# Patient Record
Sex: Female | Born: 1980 | Race: White | Hispanic: No | Marital: Married | Smoking: Never smoker
Health system: Southern US, Community
[De-identification: ages and names within clinical notes are randomized; demographics above are authoritative.]

## PROBLEM LIST (undated history)

## (undated) DIAGNOSIS — F909 Attention-deficit hyperactivity disorder, unspecified type: Secondary | ICD-10-CM

## (undated) DIAGNOSIS — F419 Anxiety disorder, unspecified: Secondary | ICD-10-CM

## (undated) HISTORY — DX: Attention-deficit hyperactivity disorder, unspecified type: F90.9

## (undated) HISTORY — DX: Anxiety disorder, unspecified: F41.9

## (undated) HISTORY — PX: KNEE SURGERY: SHX244

---

## 2011-12-01 ENCOUNTER — Ambulatory Visit: Payer: Self-pay | Admitting: Obstetrics and Gynecology

## 2012-07-11 DIAGNOSIS — N979 Female infertility, unspecified: Secondary | ICD-10-CM | POA: Insufficient documentation

## 2012-07-26 DIAGNOSIS — F419 Anxiety disorder, unspecified: Secondary | ICD-10-CM | POA: Insufficient documentation

## 2014-08-12 DIAGNOSIS — F988 Other specified behavioral and emotional disorders with onset usually occurring in childhood and adolescence: Secondary | ICD-10-CM | POA: Insufficient documentation

## 2015-06-03 DIAGNOSIS — D239 Other benign neoplasm of skin, unspecified: Secondary | ICD-10-CM

## 2015-06-03 HISTORY — DX: Other benign neoplasm of skin, unspecified: D23.9

## 2015-12-26 ENCOUNTER — Encounter: Payer: Self-pay | Admitting: Family Medicine

## 2015-12-26 ENCOUNTER — Encounter (INDEPENDENT_AMBULATORY_CARE_PROVIDER_SITE_OTHER): Payer: BC Managed Care – PPO | Admitting: Family Medicine

## 2015-12-26 VITALS — BP 110/70 | HR 80 | Temp 98.7°F | Ht 65.0 in | Wt 144.4 lb

## 2015-12-26 DIAGNOSIS — T7840XA Allergy, unspecified, initial encounter: Secondary | ICD-10-CM

## 2015-12-26 NOTE — Progress Notes (Deleted)
This encounter was created in error - please disregard.

## 2015-12-26 NOTE — Progress Notes (Signed)
Name: Regina [name]   MRN: [id number]    DOB: 07-06-1981   Date:12/26/2015       Progress Note  Subjective  Chief Complaint  Chief Complaint  Patient presents with  . Eye Problem    irritation  . Error    HPI C/o L eye feeling "funny" and swollen for past 3-4 days.  No trauma.  No redness.  No drainage reported.  Periorbital tissue sl. Tender to touch.  She does have seasonal allergies in the spring.Marland Kitchen  No visual disturbances.  R eye ok.    No problem-specific assessment & plan notes found for this encounter.   History reviewed. No pertinent past medical history.  Social History  Substance Use Topics  . Smoking status: Never Smoker   . Smokeless tobacco: Not on file  . Alcohol Use: 0.0 oz/week    0 Standard drinks or equivalent per week     Current outpatient prescriptions:  .  methylphenidate (RITALIN) 10 MG tablet, TAKE 1 1/2 TABS BY MOUTH EVERY MORNING AND 1/2 TO 1 TAB AT NOON AS DIRECTED, Disp: , Rfl: 0  Not on File  Review of Systems  Constitutional: Negative for fever, chills, weight loss and malaise/fatigue.  HENT: Negative for hearing loss.   Eyes: Positive for pain. Negative for blurred vision, double vision, photophobia, discharge and redness.  Skin: Negative for rash.  Neurological: Negative for weakness and headaches.      Objective  Filed Vitals:   12/26/15 1414  BP: 110/70  Pulse: 80  Temp: 98.7 F (37.1 C)  TempSrc: Oral  Height: 5\' 5"  (1.651 m)  Weight: 144 lb 6.4 oz (65.499 kg)     Physical Exam  Constitutional: She is well-developed, well-nourished, and in no distress. No distress.  HENT:  Head: Normocephalic and atraumatic.  Eyes: Conjunctivae and EOM are normal. Pupils are equal, round, and reactive to light. No scleral icterus.  L eye with very minimal periorbital edema, esp. Beneath eye ad just lat. To upper lid.  Minimally tender in this area.  No conjunctival redness.  No drainage.  No foreign body seen.  Normal tearing.  Vitals  reviewed.     No results found for this or any previous visit (from the past 2160 hour(s)).   Assessment & Plan  1. Allergic reaction, initial encounter Cold compresses 3-4 times a day. OTC Claritin, 1 daily.

## 2016-01-01 ENCOUNTER — Telehealth: Payer: Self-pay | Admitting: Family Medicine

## 2016-01-01 NOTE — Telephone Encounter (Signed)
Pt called requesting you call her she states that her eye  still have Infection pt call back # is  [phone]

## 2016-01-02 NOTE — Telephone Encounter (Signed)
Talked to patient.  She saw Optomitrist yesterday.  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  Treatment with eye drops have helped.  She will contact me again early next week if not better/resolved.-jh

## 2016-06-23 DIAGNOSIS — O0993 Supervision of high risk pregnancy, unspecified, third trimester: Secondary | ICD-10-CM | POA: Insufficient documentation

## 2016-06-23 LAB — OB RESULTS CONSOLE HIV ANTIBODY (ROUTINE TESTING): HIV: NONREACTIVE

## 2016-06-23 LAB — OB RESULTS CONSOLE HEPATITIS B SURFACE ANTIGEN: Hepatitis B Surface Ag: NEGATIVE

## 2016-06-23 LAB — OB RESULTS CONSOLE VARICELLA ZOSTER ANTIBODY, IGG: VARICELLA IGG: IMMUNE

## 2016-06-23 LAB — OB RESULTS CONSOLE RUBELLA ANTIBODY, IGM: RUBELLA: IMMUNE

## 2016-06-23 LAB — OB RESULTS CONSOLE RPR: RPR: NONREACTIVE

## 2016-07-21 DIAGNOSIS — Z3183 Encounter for assisted reproductive fertility procedure cycle: Secondary | ICD-10-CM | POA: Insufficient documentation

## 2016-08-11 ENCOUNTER — Ambulatory Visit (INDEPENDENT_AMBULATORY_CARE_PROVIDER_SITE_OTHER): Payer: BC Managed Care – PPO | Admitting: Family Medicine

## 2016-08-11 ENCOUNTER — Encounter: Payer: Self-pay | Admitting: Family Medicine

## 2016-08-11 VITALS — BP 107/72 | HR 91 | Temp 98.1°F | Resp 16 | Ht 65.0 in | Wt 149.6 lb

## 2016-08-11 DIAGNOSIS — R1110 Vomiting, unspecified: Secondary | ICD-10-CM | POA: Diagnosis not present

## 2016-08-11 DIAGNOSIS — J069 Acute upper respiratory infection, unspecified: Secondary | ICD-10-CM

## 2016-08-11 MED ORDER — SALINE SPRAY 0.65 % NA SOLN: 2.0000 | NASAL | 0 refills | Status: DC | PRN

## 2016-08-11 MED ORDER — GUAIFENESIN ER 600 MG PO TB12: 600.0000 mg | ORAL_TABLET | Freq: Two times a day (BID) | ORAL | Status: DC

## 2016-08-11 MED ORDER — OXYMETAZOLINE HCL 0.05 % NA SOLN: 1.0000 | Freq: Two times a day (BID) | NASAL | 0 refills | Status: DC

## 2016-08-11 NOTE — Progress Notes (Signed)
Subjective:    Patient ID: Regina [name], female    DOB: 08/19/1981, 35 y.o.   MRN: [id number]  HPI: Regina [name] is a 35 y.o. female presenting on 08/11/2016 for Nasal Congestion (cough mucus--clear darker when pt cough making her like throwing up HA sinus)   HPI  Pt presents for cough, congestion, no fever. Symptoms started Thursday. Coughing hard- vomited last night. She is 5 months pregnant- does vomit quite a bit. No chest tightness. Lots of nasal congestion. Cough is better today. No fever. Coughing up- clear/white- no blood tinge. No shortness of breath. No trouble breathing.  Home treatment: Tylenol. Decongestant nasal spray. Is taking sudafed- listed on safe list Feeling overall better today.  History reviewed. No pertinent past medical history.  No current outpatient prescriptions on file prior to visit.   No current facility-administered medications on file prior to visit.     Review of Systems  HENT: Positive for congestion, postnasal drip, rhinorrhea and sinus pressure. Negative for sore throat, trouble swallowing and voice change.   Respiratory: Positive for cough. Negative for apnea, chest tightness, shortness of breath and wheezing.   Gastrointestinal: Positive for nausea (r/t pregnancy) and vomiting (post tussive).  Genitourinary: Negative.   Musculoskeletal: Negative.   Neurological: Negative for dizziness, syncope, weakness and headaches.  Hematological: Positive for adenopathy.   Per HPI unless specifically indicated above     Objective:    BP 107/72   Pulse 91   Temp 98.1 F (36.7 C) (Oral)   Resp 16   Ht 5\' 5"  (1.651 m)   Wt 149 lb 9.6 oz (67.9 kg)   LMP 12/03/2015 (Approximate)   SpO2 99%   BMI 24.89 kg/m   Wt Readings from Last 3 Encounters:  08/11/16 149 lb 9.6 oz (67.9 kg)  12/26/15 144 lb 6.4 oz (65.5 kg)    Physical Exam  Constitutional: She appears well-developed and well-nourished. No distress.  HENT:  Head: Normocephalic and atraumatic.   Right Ear: Hearing and tympanic membrane normal. Tympanic membrane is not erythematous and not bulging.  Left Ear: Hearing and tympanic membrane normal. Tympanic membrane is not erythematous and not bulging.  Nose: Mucosal edema and rhinorrhea present. No sinus tenderness or nasal septal hematoma. Right sinus exhibits frontal sinus tenderness. Right sinus exhibits no maxillary sinus tenderness. Left sinus exhibits frontal sinus tenderness.  Mouth/Throat: Uvula is midline and mucous membranes are normal. No uvula swelling. Posterior oropharyngeal erythema (mild) present. No posterior oropharyngeal edema.  Neck: Neck supple. No Brudzinski's sign and no Kernig's sign noted.  Cardiovascular: Normal rate, regular rhythm and normal heart sounds.   Pulmonary/Chest: Breath sounds normal. No accessory muscle usage. No tachypnea. No respiratory distress.  Lymphadenopathy:    She has no cervical adenopathy.   No results found for this or any previous visit.    Assessment & Plan:   Problem List Items Addressed This Visit    None    Visit Diagnoses    Upper respiratory infection    -  Primary   Likley viral etiology. Pt feeling much better today. Treat with supportive care at home. Mucinex DM considered safe in pregnancy. Will treat for sinusitis of symptoms of second worsening occur.    Relevant Medications   guaiFENesin (MUCINEX) 600 MG 12 hr tablet   sodium chloride (OCEAN) 0.65 % SOLN nasal spray   oxymetazoline (AFRIN NASAL SPRAY) 0.05 % nasal spray   Post-tussive emesis       Likely exacerbated by pregnancy. None today.  Treat the cough at this time. Pt has zofran as ordered by OB for severe nausea and vomiting.       Meds ordered this encounter  Medications  . ondansetron (ZOFRAN-ODT) 4 MG disintegrating tablet  . ranitidine (ZANTAC) 300 MG tablet    Sig: Take by mouth.  Marland Kitchen guaiFENesin (MUCINEX) 600 MG 12 hr tablet    Sig: Take 1 tablet (600 mg total) by mouth 2 (two) times daily.     Order Specific Question:   Supervising Provider    Answer:   Arlis Porta [id number]  . sodium chloride (OCEAN) 0.65 % SOLN nasal spray    Sig: Place 2 sprays into both nostrils as needed for congestion.    Refill:  0    Order Specific Question:   Supervising Provider    Answer:   Arlis Porta [id number]  . oxymetazoline (AFRIN NASAL SPRAY) 0.05 % nasal spray    Sig: Place 1 spray into both nostrils 2 (two) times daily. Use for 3 days only.    Dispense:  30 mL    Refill:  0    Order Specific Question:   Supervising Provider    Answer:   Arlis Porta [id number]      Follow up plan: Return if symptoms worsen or fail to improve.

## 2016-08-11 NOTE — Patient Instructions (Signed)
You can use supportive care at home to help with your symptoms. Try Mucinexhelp break up the congestion and soothe your cough. You can takes this twice daily. Honey is a natural cough suppressant- so add it to your tea in the morning.  If you have a humidifer, set that up in your bedroom at night.   Your symptoms are consistent with a viral upper respiratory infection. At this time there is no need for antibiotics.  If your symptoms persist for > 10 days or get better and than worsen please let me know. You may have a secondary bacterial infection.  Please seek immediate medical attention if you develop shortness of breath , chest pain/tightness, fever > 103 F or other concerning symptoms.

## 2016-10-11 DIAGNOSIS — F411 Generalized anxiety disorder: Secondary | ICD-10-CM | POA: Insufficient documentation

## 2016-10-11 DIAGNOSIS — O09523 Supervision of elderly multigravida, third trimester: Secondary | ICD-10-CM | POA: Insufficient documentation

## 2016-10-12 ENCOUNTER — Telehealth: Payer: Self-pay | Admitting: *Deleted

## 2016-10-12 NOTE — Telephone Encounter (Signed)
Patient advised.[place] 

## 2016-10-12 NOTE — Telephone Encounter (Signed)
With her being pregnant, I would not start a benzodiazepam med without GYNs approval.  Best to stick with advice of GYN.-jh

## 2016-10-12 NOTE — Telephone Encounter (Signed)
Patient is 7 mos pregnant. She is having anxiety and was prescribed Buspar 7.5mg  by gyn. It is short acting and takes several weeks to get into system. She called them back and they are not able to get her in this week. She was told to go to ER. Patient wants to know if you can see her? [phone]

## 2016-10-23 ENCOUNTER — Encounter (HOSPITAL_COMMUNITY): Payer: Self-pay

## 2016-11-04 ENCOUNTER — Ambulatory Visit (INDEPENDENT_AMBULATORY_CARE_PROVIDER_SITE_OTHER): Payer: BC Managed Care – PPO | Admitting: Psychiatry

## 2016-11-04 ENCOUNTER — Encounter: Payer: Self-pay | Admitting: Psychiatry

## 2016-11-04 VITALS — BP 119/84 | HR 66 | Temp 97.7°F | Wt 155.8 lb

## 2016-11-04 DIAGNOSIS — F411 Generalized anxiety disorder: Secondary | ICD-10-CM

## 2016-11-04 NOTE — Progress Notes (Signed)
Psychiatric Initial Adult Assessment   Patient Identification: Regina [name] MRN:  [id number] Date of Evaluation:  11/04/2016 Referral Source: Dr.Beasley Chief Complaint:   Chief Complaint    Establish Care; Anxiety     Visit Diagnosis:    ICD-9-CM ICD-10-CM   1. GAD (generalized anxiety disorder) 300.02 F41.1     History of Present Illness:  Patient is a 35 year old Caucasian female who is 8 months pregnant and was referred by her OB/GYN for an evaluation of her anxiety. Patient reports that she started to have panic attacks about a month ago and was started on BuSpar and Ativan as needed by her obstetrician. States that the BuSpar has not been helpful and they started her on Zoloft at 50 mg which has been helpful to an extent. She is continuing to take the Ativan at 0.5 mg as needed every other day. States that she will be starting to take the Zoloft at 100 mg starting today. States that the only other time she has had anxiety was about 10 years ago. Patient reports being very anxious about how she would organize her life after the birth of the baby. She is a Pharmacist, hospital and teaches fourth grade classes. He reports having a good marriage and that they have been trying to have a baby for 7 years. Though she is excited about the baby states that she is very emotional about everything these days. Patient became tearful during the session with this clinician.  Patient denies being depressed. She denies any psychotic symptoms. Denies problems with drugs or alcohol. Denies abuse of any kind. She has never been hospitalized psychiatrically. Reports that she see someone in [place] for ADD and is unable to recall the name of her medication.   Past Psychiatric History: she was seeing a therapist for ADD at [place].  Previous Psychotropic Medications: Yes , Ritalin  Substance Abuse History in the last 12 months:  No.  Consequences of Substance Abuse: Negative  Past Medical History:  Past  Medical History:  Diagnosis Date  . ADHD (attention deficit hyperactivity disorder)   . Anxiety     Past Surgical History:  Procedure Laterality Date  . KNEE SURGERY      Family Psychiatric History: see below  Family History:  Family History  Problem Relation Age of Onset  . Multiple sclerosis Mother   . Anxiety disorder Mother     Social History:   Social History   Social History  . Marital status: Married    Spouse name: N/A  . Number of children: N/A  . Years of education: N/A   Social History Main Topics  . Smoking status: Never Smoker  . Smokeless tobacco: Never Used  . Alcohol use No  . Drug use: No  . Sexual activity: Not Currently   Other Topics Concern  . None   Social History Narrative  . None    Additional Social History: Lives with her husband.  Allergies:  No Known Allergies  Metabolic Disorder Labs: No results found for: HGBA1C, MPG No results found for: PROLACTIN No results found for: CHOL, TRIG, HDL, CHOLHDL, VLDL, LDLCALC   Current Medications: Current Outpatient Prescriptions  Medication Sig Dispense Refill  . busPIRone (BUSPAR) 7.5 MG tablet     . LORazepam (ATIVAN) 0.5 MG tablet     . oxymetazoline (AFRIN NASAL SPRAY) 0.05 % nasal spray Place 1 spray into both nostrils 2 (two) times daily. Use for 3 days only. 30 mL 0  . ranitidine (ZANTAC)  300 MG tablet Take by mouth.    . sertraline (ZOLOFT) 100 MG tablet      No current facility-administered medications for this visit.     Neurologic: Headache: No Seizure: No Paresthesias:No  Musculoskeletal: Strength & Muscle Tone: within normal limits Gait & Station: normal Patient leans: N/A  Psychiatric Specialty Exam: ROS  Blood pressure 119/84, pulse 66, temperature 97.7 F (36.5 C), temperature source Oral, weight 155 lb 12.8 oz (70.7 kg), last menstrual period 12/03/2015.Body mass index is 25.93 kg/m.  General Appearance: Casual  Eye Contact:  Fair  Speech:  Clear and  Coherent  Volume:  Normal  Mood:  Anxious  Affect:  Congruent  Thought Process:  Coherent  Orientation:  Full (Time, Place, and Person)  Thought Content:  Logical  Suicidal Thoughts:  No  Homicidal Thoughts:  No  Memory:  Immediate;   Fair Recent;   Fair Remote;   Fair  Judgement:  Fair  Insight:  Fair  Psychomotor Activity:  Normal  Concentration:  Concentration: Fair and Attention Span: Fair  Recall:  [employer] of Knowledge:Fair  Language: Fair  Akathisia:  No  Handed:  Right  AIMS (if indicated):  na  Assets:  Communication Skills Desire for Improvement Financial Resources/Insurance Housing Physical Health Resilience Social Support Vocational/Educational  ADL's:  Intact  Cognition: WNL  Sleep:  fair    Treatment Plan Summary:  Panic attacks Patient to take Zoloft at 100 mg starting today under direction off her obstetrician Dr. Leafy Ro. Patient will start therapy with [facility] to address her fear surrounding childbirth and her anxiety. He shouldn't is recommended to minimize use of Ativan given her pregnancy status. She is also informed that this clinician does not prescribe Ativan or any benzodiazepines. Patient will taper off the BuSpar as instructed by her obstetrician.  Since patient is under the care of her obstetrician and has been started on the current regimen she will follow-up with Dr. Leafy Ro and if needed will transition to this clinician after the birth of her baby. Discussed discussed with patient that her scary scattered among different clinicians given that she see someone for ADD in [place]. She is recommended to see that clinician for her anxiety after the birth of her baby or choose to have all her care here.  Return to clinic in 2 months time or call before if needed    Elvin So, MD 12/13/201711:10 AM

## 2016-11-18 LAB — OB RESULTS CONSOLE GC/CHLAMYDIA
CHLAMYDIA, DNA PROBE: NEGATIVE
GC PROBE AMP, GENITAL: NEGATIVE

## 2016-11-18 LAB — OB RESULTS CONSOLE GBS: GBS: NEGATIVE

## 2016-11-23 NOTE — L&D Delivery Note (Signed)
Delivery Note At 8:00 PM a viable female was delivered via Vaginal, Spontaneous Delivery (Presentation: ; ROA ).  APGAR: 4, 7; weight 6 lb 4.5 oz (2850 g).  Double nuchal cord reduced  Placenta status: intact  , .  Cord:3 v  with the following complications: Marland Kitchen  Meconium staining with SROM 36 hrs ago  Anesthesia:  CLE Episiotomy: Median Lacerations:  none Suture Repair: 2.0 3.0 vicryl Est. Blood Loss (mL):250 cc   Peds in attendance and requested cord clamped and cut once delivered Mom to postpartum.  Baby to Couplet care / Skin to Skin.  SCHERMERHORN,THOMAS 12/05/2016, 8:29 PM

## 2016-12-04 ENCOUNTER — Inpatient Hospital Stay
Admission: EM | Admit: 2016-12-04 | Discharge: 2016-12-07 | DRG: 775 | Disposition: A | Discharge status: Home / Self Care | Payer: BC Managed Care – PPO | Attending: Obstetrics and Gynecology | Admitting: Obstetrics and Gynecology

## 2016-12-04 DIAGNOSIS — O4202 Full-term premature rupture of membranes, onset of labor within 24 hours of rupture: Secondary | ICD-10-CM | POA: Diagnosis present

## 2016-12-04 DIAGNOSIS — Z3A38 38 weeks gestation of pregnancy: Secondary | ICD-10-CM | POA: Diagnosis not present

## 2016-12-04 DIAGNOSIS — Z3493 Encounter for supervision of normal pregnancy, unspecified, third trimester: Secondary | ICD-10-CM | POA: Diagnosis present

## 2016-12-04 DIAGNOSIS — Z90710 Acquired absence of both cervix and uterus: Secondary | ICD-10-CM

## 2016-12-04 DIAGNOSIS — D696 Thrombocytopenia, unspecified: Secondary | ICD-10-CM | POA: Diagnosis not present

## 2016-12-04 LAB — TYPE AND SCREEN
ABO/RH(D): B POS
Antibody Screen: NEGATIVE

## 2016-12-04 LAB — CBC
HCT: 32.6 % — ABNORMAL LOW (ref 35.0–47.0)
Hemoglobin: 10.9 g/dL — ABNORMAL LOW (ref 12.0–16.0)
MCH: 26.5 pg (ref 26.0–34.0)
MCHC: 33.3 g/dL (ref 32.0–36.0)
MCV: 79.5 fL — AB (ref 80.0–100.0)
PLATELETS: 144 10*3/uL — AB (ref 150–440)
RBC: 4.11 MIL/uL (ref 3.80–5.20)
RDW: 19.4 % — ABNORMAL HIGH (ref 11.5–14.5)
WBC: 9.5 10*3/uL (ref 3.6–11.0)

## 2016-12-04 MED ORDER — ZOLPIDEM TARTRATE 5 MG PO TABS
5.0000 mg | ORAL_TABLET | Freq: Every evening | ORAL | Status: DC | PRN
  Administered 2016-12-04: 5 mg via ORAL

## 2016-12-04 MED ORDER — SOD CITRATE-CITRIC ACID 500-334 MG/5ML PO SOLN: 30.0000 mL | ORAL | Status: DC | PRN

## 2016-12-04 MED ORDER — ZOLPIDEM TARTRATE 5 MG PO TABS
ORAL_TABLET | ORAL | Status: AC
  Filled 2016-12-04: qty 1

## 2016-12-04 MED ORDER — ONDANSETRON HCL 4 MG/2ML IJ SOLN: 4.0000 mg | Freq: Four times a day (QID) | INTRAMUSCULAR | Status: DC | PRN

## 2016-12-04 MED ORDER — BUTORPHANOL TARTRATE 1 MG/ML IJ SOLN
1.0000 mg | INTRAMUSCULAR | Status: DC | PRN
  Administered 2016-12-05: 1 mg via INTRAVENOUS
  Filled 2016-12-04: qty 1

## 2016-12-04 MED ORDER — ZOLPIDEM TARTRATE 5 MG PO TABS
ORAL_TABLET | ORAL | Status: AC
  Administered 2016-12-04: 5 mg via ORAL
  Filled 2016-12-04: qty 1

## 2016-12-04 MED ORDER — OXYTOCIN BOLUS FROM INFUSION
500.0000 mL | Freq: Once | INTRAVENOUS | Status: AC
  Administered 2016-12-05: 500 mL via INTRAVENOUS

## 2016-12-04 MED ORDER — LACTATED RINGERS IV SOLN
INTRAVENOUS | Status: DC
  Administered 2016-12-04 – 2016-12-05 (×3): via INTRAVENOUS

## 2016-12-04 MED ORDER — LIDOCAINE HCL (PF) 1 % IJ SOLN
30.0000 mL | INTRAMUSCULAR | Status: AC | PRN
  Administered 2016-12-05: 30 mL via SUBCUTANEOUS
  Administered 2016-12-05: 1.5 mL via SUBCUTANEOUS

## 2016-12-04 MED ORDER — ACETAMINOPHEN 325 MG PO TABS: 650.0000 mg | ORAL_TABLET | ORAL | Status: DC | PRN

## 2016-12-04 MED ORDER — LACTATED RINGERS IV SOLN: 500.0000 mL | INTRAVENOUS | Status: DC | PRN

## 2016-12-04 MED ORDER — OXYTOCIN 40 UNITS IN LACTATED RINGERS INFUSION - SIMPLE MED
2.5000 [IU]/h | INTRAVENOUS | Status: DC
  Administered 2016-12-05: 2.5 [IU]/h via INTRAVENOUS
  Filled 2016-12-04: qty 1000

## 2016-12-04 NOTE — OB Triage Note (Signed)
Patient presents complaining of leaking fluid since this morning, becoming worse around 1 pm with green tint and chunks at time. No pain, blood, or pain noted.

## 2016-12-04 NOTE — H&P (Signed)
Regina [name] is a 36 y.o. female presenting for LOF this afternoon. IVF baby .+ meconium staining  OB History    Gravida Para Term Preterm AB Living   1             SAB TAB Ectopic Multiple Live Births         0       Past Medical History:  Diagnosis Date  . ADHD (attention deficit hyperactivity disorder)   . Anxiety    Past Surgical History:  Procedure Laterality Date  . KNEE SURGERY     Family History: family history includes Anxiety disorder in her mother; Multiple sclerosis in her mother. Social History:  reports that she has never smoked. She has never used smokeless tobacco. She reports that she does not drink alcohol or use drugs.     Maternal Diabetes: No Genetic Screening: Declined Maternal Ultrasounds/Referrals: Normal Fetal Ultrasounds or other Referrals: normal Maternal Substance Abuse:  No Significant Maternal Medications:  None Significant Maternal Lab Results:  None Other Comments:  None  ROS History Dilation: Closed Effacement (%): 50 Station: Ballotable Exam by:: Tonyetta Berko Blood pressure (!) 133/91, pulse 74, temperature 98.2 F (36.8 C), temperature source Oral, resp. rate 20, height 5\' 5"  (1.651 m), weight 158 lb (71.7 kg), last menstrual period 12/03/2015, unknown if currently breastfeeding. Exam Physical Exam  Lungs CTA CV RRR  adb gravid efw 7/0 EFM : reassuring accels , no decels , ctx q 3-8 min  Prenatal labs: ABO, Rh:  B+ Antibody:  neg Rubella: Immune (08/01 0000) RPR: Nonreactive (08/01 0000)  HBsAg: Negative (08/01 0000)  HIV: Non-reactive (08/01 0000)  GBS: Negative (12/27 0000)   Assessment/Plan: SROM 38+6 week , meconium staining  Reassuring fetal monitoring  Stadol prn     Regina [name] 12/04/2016, 4:49 PM

## 2016-12-05 ENCOUNTER — Inpatient Hospital Stay: Payer: BC Managed Care – PPO | Admitting: Anesthesiology

## 2016-12-05 LAB — CBC
HCT: 34.3 % — ABNORMAL LOW (ref 35.0–47.0)
Hemoglobin: 11 g/dL — ABNORMAL LOW (ref 12.0–16.0)
MCH: 25.6 pg — AB (ref 26.0–34.0)
MCHC: 32.2 g/dL (ref 32.0–36.0)
MCV: 79.5 fL — ABNORMAL LOW (ref 80.0–100.0)
PLATELETS: 144 10*3/uL — AB (ref 150–440)
RBC: 4.31 MIL/uL (ref 3.80–5.20)
RDW: 19.5 % — ABNORMAL HIGH (ref 11.5–14.5)
WBC: 11.2 10*3/uL — AB (ref 3.6–11.0)

## 2016-12-05 LAB — PROTEIN / CREATININE RATIO, URINE
CREATININE, URINE: 114 mg/dL
PROTEIN CREATININE RATIO: 0.3 mg/mg{creat} — AB (ref 0.00–0.15)
TOTAL PROTEIN, URINE: 34 mg/dL

## 2016-12-05 LAB — RPR: RPR Ser Ql: NONREACTIVE

## 2016-12-05 MED ORDER — EPHEDRINE 5 MG/ML INJ
10.0000 mg | INTRAVENOUS | Status: DC | PRN
  Filled 2016-12-05: qty 2

## 2016-12-05 MED ORDER — INFLUENZA VAC SPLIT QUAD 0.5 ML IM SUSY: 0.5000 mL | PREFILLED_SYRINGE | INTRAMUSCULAR | Status: DC

## 2016-12-05 MED ORDER — OXYTOCIN 40 UNITS IN LACTATED RINGERS INFUSION - SIMPLE MED
1.0000 m[IU]/min | INTRAVENOUS | Status: DC
  Administered 2016-12-05: 1 m[IU]/min via INTRAVENOUS

## 2016-12-05 MED ORDER — LIDOCAINE HCL (PF) 1 % IJ SOLN
INTRAMUSCULAR | Status: AC
  Administered 2016-12-05: 30 mL via SUBCUTANEOUS
  Filled 2016-12-05: qty 30

## 2016-12-05 MED ORDER — COCONUT OIL OIL
1.0000 "application " | TOPICAL_OIL | Status: DC | PRN
  Administered 2016-12-07: 1 via TOPICAL
  Filled 2016-12-05: qty 120

## 2016-12-05 MED ORDER — WITCH HAZEL-GLYCERIN EX PADS: 1.0000 "application " | MEDICATED_PAD | CUTANEOUS | Status: DC | PRN

## 2016-12-05 MED ORDER — BENZOCAINE-MENTHOL 20-0.5 % EX AERO
1.0000 "application " | INHALATION_SPRAY | CUTANEOUS | Status: DC | PRN
  Administered 2016-12-06: 1 via TOPICAL
  Filled 2016-12-05: qty 56

## 2016-12-05 MED ORDER — TERBUTALINE SULFATE 1 MG/ML IJ SOLN: 0.2500 mg | Freq: Once | INTRAMUSCULAR | Status: DC | PRN

## 2016-12-05 MED ORDER — ONDANSETRON HCL 4 MG PO TABS: 4.0000 mg | ORAL_TABLET | ORAL | Status: DC | PRN

## 2016-12-05 MED ORDER — PHENYLEPHRINE 40 MCG/ML (10ML) SYRINGE FOR IV PUSH (FOR BLOOD PRESSURE SUPPORT)
80.0000 ug | PREFILLED_SYRINGE | INTRAVENOUS | Status: DC | PRN
  Filled 2016-12-05: qty 5

## 2016-12-05 MED ORDER — FENTANYL 2.5 MCG/ML W/ROPIVACAINE 0.2% IN NS 100 ML EPIDURAL INFUSION (ARMC-ANES)
EPIDURAL | Status: DC | PRN
  Administered 2016-12-05: 10 mL/h via EPIDURAL

## 2016-12-05 MED ORDER — LACTATED RINGERS IV SOLN: 500.0000 mL | Freq: Once | INTRAVENOUS | Status: DC

## 2016-12-05 MED ORDER — MEASLES, MUMPS & RUBELLA VAC ~~LOC~~ INJ
0.5000 mL | INJECTION | Freq: Once | SUBCUTANEOUS | Status: DC
  Filled 2016-12-05: qty 0.5

## 2016-12-05 MED ORDER — ONDANSETRON HCL 4 MG/2ML IJ SOLN: 4.0000 mg | INTRAMUSCULAR | Status: DC | PRN

## 2016-12-05 MED ORDER — FENTANYL 2.5 MCG/ML W/ROPIVACAINE 0.2% IN NS 100 ML EPIDURAL INFUSION (ARMC-ANES): 10.0000 mL/h | EPIDURAL | Status: DC

## 2016-12-05 MED ORDER — ZOLPIDEM TARTRATE 5 MG PO TABS: 5.0000 mg | ORAL_TABLET | Freq: Every evening | ORAL | Status: DC | PRN

## 2016-12-05 MED ORDER — FENTANYL 2.5 MCG/ML W/ROPIVACAINE 0.2% IN NS 100 ML EPIDURAL INFUSION (ARMC-ANES)
EPIDURAL | Status: AC
  Filled 2016-12-05: qty 100

## 2016-12-05 MED ORDER — OXYTOCIN 10 UNIT/ML IJ SOLN
INTRAMUSCULAR | Status: AC
  Filled 2016-12-05: qty 2

## 2016-12-05 MED ORDER — SENNOSIDES-DOCUSATE SODIUM 8.6-50 MG PO TABS
2.0000 | ORAL_TABLET | ORAL | Status: DC
  Administered 2016-12-06 – 2016-12-07 (×2): 2 via ORAL
  Filled 2016-12-05 (×2): qty 2

## 2016-12-05 MED ORDER — DIPHENHYDRAMINE HCL 25 MG PO CAPS: 25.0000 mg | ORAL_CAPSULE | Freq: Four times a day (QID) | ORAL | Status: DC | PRN

## 2016-12-05 MED ORDER — HYDROCODONE-ACETAMINOPHEN 5-325 MG PO TABS: 1.0000 | ORAL_TABLET | ORAL | Status: DC | PRN

## 2016-12-05 MED ORDER — IBUPROFEN 600 MG PO TABS
600.0000 mg | ORAL_TABLET | Freq: Four times a day (QID) | ORAL | Status: DC
  Administered 2016-12-05: 600 mg via ORAL
  Filled 2016-12-05: qty 1

## 2016-12-05 MED ORDER — ACETAMINOPHEN 325 MG PO TABS: 650.0000 mg | ORAL_TABLET | ORAL | Status: DC | PRN

## 2016-12-05 MED ORDER — MISOPROSTOL 200 MCG PO TABS
ORAL_TABLET | ORAL | Status: AC
  Filled 2016-12-05: qty 4

## 2016-12-05 MED ORDER — SIMETHICONE 80 MG PO CHEW: 80.0000 mg | CHEWABLE_TABLET | ORAL | Status: DC | PRN

## 2016-12-05 MED ORDER — LIDOCAINE-EPINEPHRINE (PF) 1.5 %-1:200000 IJ SOLN
INTRAMUSCULAR | Status: DC | PRN
  Administered 2016-12-05: 3 mL via PERINEURAL

## 2016-12-05 MED ORDER — PRENATAL MULTIVITAMIN CH
1.0000 | ORAL_TABLET | Freq: Every day | ORAL | Status: DC
  Administered 2016-12-06: 1 via ORAL
  Filled 2016-12-05: qty 1

## 2016-12-05 MED ORDER — MAGNESIUM HYDROXIDE 400 MG/5ML PO SUSP: 30.0000 mL | ORAL | Status: DC | PRN

## 2016-12-05 MED ORDER — FERROUS SULFATE 325 (65 FE) MG PO TABS
325.0000 mg | ORAL_TABLET | Freq: Two times a day (BID) | ORAL | Status: DC
  Administered 2016-12-06 – 2016-12-07 (×3): 325 mg via ORAL
  Filled 2016-12-05 (×3): qty 1

## 2016-12-05 MED ORDER — AMMONIA AROMATIC IN INHA
RESPIRATORY_TRACT | Status: AC
  Filled 2016-12-05: qty 10

## 2016-12-05 MED ORDER — DIBUCAINE 1 % RE OINT: 1.0000 "application " | TOPICAL_OINTMENT | RECTAL | Status: DC | PRN

## 2016-12-05 MED ORDER — DIPHENHYDRAMINE HCL 50 MG/ML IJ SOLN: 12.5000 mg | INTRAMUSCULAR | Status: DC | PRN

## 2016-12-05 NOTE — Progress Notes (Signed)
Regina [name] is a 36 y.o. G1P0 at [redacted]w[redacted]d  Subjective: Pain much improved with CLe Pitocin at 65mu/min Early decels with external monitoring  Objective: BP 112/68   Pulse 67   Temp 98 F (36.7 C) (Oral)   Resp 16   Ht 5\' 5"  (1.651 m)   Wt 158 lb (71.7 kg)   LMP 12/03/2015 (Approximate)   Breastfeeding? Unknown   BMI 26.29 kg/m  No intake/output data recorded. Total I/O In: -  Out: 150 [Urine:150]  FSE and IUPC placed   FHT:  FHR: 140 bpm, variability: minimal ,  accelerations:  Present,  decelerations:  Absent UC:   irregular, every 2 minutes SVE:   Dilation: 3 Effacement (%): 90 Station: -1 Exam by:: NB   Cx 3 cm / c /0 station  By [employer]: Lab Results  Component Value Date   WBC 11.2 (H) 12/05/2016   HGB 11.0 (L) 12/05/2016   HCT 34.3 (L) 12/05/2016   MCV 79.5 (L) 12/05/2016   PLT 144 (L) 12/05/2016    Assessment / Plan: early decels , now internalized . Adequate cx progression now   Reassuring fetal monitoring . Cont pitocin augmentation    Johnpaul Gillentine 12/05/2016, 4:20 PM

## 2016-12-05 NOTE — Anesthesia Preprocedure Evaluation (Signed)
Anesthesia Evaluation  Patient identified by MRN, date of birth, ID band Patient awake    Reviewed: Allergy & Precautions, NPO status , Patient's Chart, lab work & pertinent test results  History of Anesthesia Complications Negative for: history of anesthetic complications  Airway Mallampati: II       Dental   Pulmonary neg pulmonary ROS,           Cardiovascular negative cardio ROS       Neuro/Psych negative neurological ROS     GI/Hepatic negative GI ROS, Neg liver ROS,   Endo/Other  negative endocrine ROS  Renal/GU negative Renal ROS     Musculoskeletal   Abdominal   Peds  Hematology   Anesthesia Other Findings   Reproductive/Obstetrics (+) Pregnancy                             Anesthesia Physical Anesthesia Plan  ASA: II  Anesthesia Plan: Epidural   Post-op Pain Management:    Induction:   Airway Management Planned:   Additional Equipment:   Intra-op Plan:   Post-operative Plan:   Informed Consent: I have reviewed the patients History and Physical, chart, labs and discussed the procedure including the risks, benefits and alternatives for the proposed anesthesia with the patient or authorized representative who has indicated his/her understanding and acceptance.     Plan Discussed with:   Anesthesia Plan Comments:         Anesthesia Quick Evaluation

## 2016-12-05 NOTE — Progress Notes (Signed)
Regina [name] is a 36 y.o. G1P0 at [redacted]w[redacted]d with SROM . Irregular ctx overnight .   Subjective: Minimal pain   Objective: BP (!) 133/92   Pulse (!) 56   Temp 98 F (36.7 C) (Oral)   Resp 16   Ht 5\' 5"  (1.651 m)   Wt 158 lb (71.7 kg)   LMP 12/03/2015 (Approximate)   Breastfeeding? Unknown   BMI 26.29 kg/m  No intake/output data recorded. No intake/output data recorded.  FHT:  FHR: 140 bpm, variability: moderate,  accelerations:  Present,  decelerations:  Absent UC:   irregular, every 5-8 minutes SVE:   Dilation: Closed Effacement (%): 50 Station: Ballotable Exam by:: nancy bryan at 0800 this am   Labs: Lab Results  Component Value Date   WBC 9.5 12/04/2016   HGB 10.9 (L) 12/04/2016   HCT 32.6 (L) 12/04/2016   MCV 79.5 (L) 12/04/2016   PLT 144 (L) 12/04/2016    Assessment / Plan: Protracted latent phase  Reassuring fetal monitoring  BP elevated check protein / cr ratio, and CBC  Pitocin started  . Recheck cx with need for pain medication   Anticipated MOD:  NSVD  SCHERMERHORN,THOMAS 12/05/2016, 9:37 AM

## 2016-12-05 NOTE — Anesthesia Procedure Notes (Signed)
Epidural Patient location during procedure: OB Start time: 12/05/2016 12:57 PM End time: 12/05/2016 1:17 PM  Staffing Performed: anesthesiologist   Preanesthetic Checklist Completed: patient identified, site marked, surgical consent, pre-op evaluation, timeout performed, IV checked, risks and benefits discussed and monitors and equipment checked  Epidural Patient position: sitting Prep: Betadine Patient monitoring: heart rate, continuous pulse ox and blood pressure Approach: midline Location: L4-L5 Injection technique: LOR saline  Needle:  Needle type: Tuohy  Needle gauge: 17 G Needle length: 9 cm and 9 Needle insertion depth: 7 cm Catheter type: closed end flexible Catheter size: 19 Gauge Catheter at skin depth: 10 cm Test dose: negative and 1.5% lidocaine with Epi 1:200 K  Assessment Events: blood not aspirated, injection not painful, no injection resistance, negative IV test and no paresthesia  Additional Notes   Patient tolerated the insertion well without complications.Reason for block:procedure for pain

## 2016-12-06 LAB — CBC
HCT: 28.6 % — ABNORMAL LOW (ref 35.0–47.0)
Hemoglobin: 9.3 g/dL — ABNORMAL LOW (ref 12.0–16.0)
MCH: 26.7 pg (ref 26.0–34.0)
MCHC: 32.7 g/dL (ref 32.0–36.0)
MCV: 81.6 fL (ref 80.0–100.0)
PLATELETS: 120 10*3/uL — AB (ref 150–440)
RBC: 3.5 MIL/uL — ABNORMAL LOW (ref 3.80–5.20)
RDW: 22.2 % — ABNORMAL HIGH (ref 11.5–14.5)
WBC: 12.1 10*3/uL — AB (ref 3.6–11.0)

## 2016-12-06 MED ORDER — GUAIFENESIN ER 600 MG PO TB12
1200.0000 mg | ORAL_TABLET | Freq: Two times a day (BID) | ORAL | Status: DC
  Administered 2016-12-06 – 2016-12-07 (×2): 1200 mg via ORAL
  Filled 2016-12-06: qty 2

## 2016-12-06 MED ORDER — IBUPROFEN 600 MG PO TABS
600.0000 mg | ORAL_TABLET | Freq: Four times a day (QID) | ORAL | Status: DC
  Administered 2016-12-06 – 2016-12-07 (×3): 600 mg via ORAL
  Filled 2016-12-06 (×4): qty 1

## 2016-12-06 MED ORDER — GUAIFENESIN ER 600 MG PO TB12
600.0000 mg | ORAL_TABLET | Freq: Two times a day (BID) | ORAL | Status: DC
  Filled 2016-12-06: qty 2

## 2016-12-06 MED ORDER — MENTHOL 3 MG MT LOZG
1.0000 | LOZENGE | OROMUCOSAL | Status: DC | PRN
  Administered 2016-12-06: 3 mg via ORAL
  Filled 2016-12-06: qty 9

## 2016-12-06 MED ORDER — DM-GUAIFENESIN ER 30-600 MG PO TB12
2.0000 | ORAL_TABLET | Freq: Two times a day (BID) | ORAL | Status: DC
Start: 2016-12-06 — End: 2016-12-06
  Filled 2016-12-06: qty 2

## 2016-12-06 MED ORDER — DEXTROMETHORPHAN POLISTIREX ER 30 MG/5ML PO SUER
60.0000 mg | Freq: Two times a day (BID) | ORAL | Status: DC
  Administered 2016-12-06 – 2016-12-07 (×2): 60 mg via ORAL
  Filled 2016-12-06: qty 10
  Filled 2016-12-06: qty 5
  Filled 2016-12-06: qty 10

## 2016-12-06 MED ORDER — DEXTROMETHORPHAN POLISTIREX ER 30 MG/5ML PO SUER
30.0000 mg | Freq: Two times a day (BID) | ORAL | Status: DC
  Filled 2016-12-06: qty 5

## 2016-12-06 MED ORDER — IBUPROFEN 600 MG PO TABS
600.0000 mg | ORAL_TABLET | Freq: Four times a day (QID) | ORAL | Status: DC
  Administered 2016-12-06: 600 mg via ORAL
  Filled 2016-12-06: qty 1

## 2016-12-06 NOTE — Anesthesia Postprocedure Evaluation (Signed)
Anesthesia Post Note  Patient: Regina [name]  Procedure(s) Performed: * No procedures listed *  Patient location during evaluation: Mother Baby Anesthesia Type: Epidural Level of consciousness: awake and alert Pain management: pain level controlled Vital Signs Assessment: post-procedure vital signs reviewed and stable Respiratory status: spontaneous breathing, nonlabored ventilation and respiratory function stable Cardiovascular status: stable Postop Assessment: no headache, no backache and epidural receding Anesthetic complications: no     Last Vitals:  Vitals:   12/06/16 0235 12/06/16 0835  BP: 123/81 120/80  Pulse: (!) 59 67  Resp: 17 16  Temp: 36.9 C 36.7 C    Last Pain:  Vitals:   12/06/16 1055  TempSrc:   PainSc: 1                  Honestee Revard K

## 2016-12-06 NOTE — Discharge Instructions (Signed)
Vaginal Delivery, Care After °Refer to this sheet in the next few weeks. These instructions provide you with information about caring for yourself after vaginal delivery. Your health care provider may also give you more specific instructions. Your treatment has been planned according to current medical practices, but problems sometimes occur. Call your health care provider if you have any problems or questions. °What can I expect after the procedure? °After vaginal delivery, it is common to have: °· Some bleeding from your vagina. °· Soreness in your abdomen, your vagina, and the area of skin between your vaginal opening and your anus (perineum). °· Pelvic cramps. °· Fatigue. °Follow these instructions at home: °Medicines °· Take over-the-counter and prescription medicines only as told by your health care provider. °· If you were prescribed an antibiotic medicine, take it as told by your health care provider. Do not stop taking the antibiotic until it is finished. °Driving °· Do not drive or operate heavy machinery while taking prescription pain medicine. °· Do not drive for 24 hours if you received a sedative. °Lifestyle °· Do not drink alcohol. This is especially important if you are breastfeeding or taking medicine to relieve pain. °· Do not use tobacco products, including cigarettes, chewing tobacco, or e-cigarettes. If you need help quitting, ask your health care provider. °Eating and drinking °· Drink at least 8 eight-ounce glasses of water every day unless you are told not to by your health care provider. If you choose to breastfeed your baby, you may need to drink more water than this. °· Eat high-fiber foods every day. These foods may help prevent or relieve constipation. High-fiber foods include: °¨ Whole grain cereals and breads. °¨ Brown rice. °¨ Beans. °¨ Fresh fruits and vegetables. °Activity °· Return to your normal activities as told by your health care provider. Ask your health care provider what  activities are safe for you. °· Rest as much as possible. Try to rest or take a nap when your baby is sleeping. °· Do not lift anything that is heavier than your baby or 10 lb (4.5 kg) until your health care provider says that it is safe. °· Talk with your health care provider about when you can engage in sexual activity. This may depend on your: °¨ Risk of infection. °¨ Rate of healing. °¨ Comfort and desire to engage in sexual activity. °Vaginal Care °· If you have an episiotomy or a vaginal tear, check the area every day for signs of infection. Check for: °¨ More redness, swelling, or pain. °¨ More fluid or blood. °¨ Warmth. °¨ Pus or a bad smell. °· Do not use tampons or douches until your health care provider says this is safe. °· Watch for any blood clots that may pass from your vagina. These may look like clumps of dark red, brown, or black discharge. °General instructions °· Keep your perineum clean and dry as told by your health care provider. °· Wear loose, comfortable clothing. °· Wipe from front to back when you use the toilet. °· Ask your health care provider if you can shower or take a bath. If you had an episiotomy or a perineal tear during labor and delivery, your health care provider may tell you not to take baths for a certain length of time. °· Wear a bra that supports your breasts and fits you well. °· If possible, have someone help you with household activities and help care for your baby for at least a few days after you leave   you leave the hospital. °· Keep all follow-up visits for you and your baby as told by your health care provider. This is important. °Contact a health care provider if: °· You have: °? Vaginal discharge that has a bad smell. °? Difficulty urinating. °? Pain when urinating. °? A sudden increase or decrease in the frequency of your bowel movements. °? More redness, swelling, or pain around your episiotomy or vaginal tear. °? More fluid or blood coming from your episiotomy or  vaginal tear. °? Pus or a bad smell coming from your episiotomy or vaginal tear. °? A fever. °? A rash. °? Little or no interest in activities you used to enjoy. °? Questions about caring for yourself or your baby. °· Your episiotomy or vaginal tear feels warm to the touch. °· Your episiotomy or vaginal tear is separating or does not appear to be healing. °· Your breasts are painful, hard, or turn red. °· You feel unusually sad or worried. °· You feel nauseous or you vomit. °· You pass large blood clots from your vagina. If you pass a blood clot from your vagina, save it to show to your health care provider. Do not flush blood clots down the toilet without having your health care provider look at them. °· You urinate more than usual. °· You are dizzy or light-headed. °· You have not breastfed at all and you have not had a menstrual period for 12 weeks after delivery. °· You have stopped breastfeeding and you have not had a menstrual period for 12 weeks after you stopped breastfeeding. °Get help right away if: °· You have: °? Pain that does not go away or does not get better with medicine. °? Chest pain. °? Difficulty breathing. °? Blurred vision or spots in your vision. °? Thoughts about hurting yourself or your baby. °· You develop pain in your abdomen or in one of your legs. °· You develop a severe headache. °· You faint. °· You bleed from your vagina so much that you fill two sanitary pads in one hour. °This information is not intended to replace advice given to you by your health care provider. Make sure you discuss any questions you have with your health care provider. °Document Released: 11/06/2000 Document Revised: 04/22/2016 Document Reviewed: 11/24/2015 °Elsevier Interactive Patient Education © 2017 [employer] ° ° °Breastfeeding °Deciding to breastfeed is one of the best choices you can make for you and your baby. A change in hormones during pregnancy causes your breast tissue to grow and increases the  number and size of your milk ducts. These hormones also allow proteins, sugars, and fats from your blood supply to make breast milk in your milk-producing glands. Hormones prevent breast milk from being released before your baby is born as well as prompt milk flow after birth. Once breastfeeding has begun, thoughts of your baby, as well as his or her sucking or crying, can stimulate the release of milk from your milk-producing glands. °Benefits of breastfeeding °For Your Baby °· Your first milk (colostrum) helps your baby's digestive system function better. °· There are antibodies in your milk that help your baby fight off infections. °· Your baby has a lower incidence of asthma, allergies, and sudden infant death syndrome. °· The nutrients in breast milk are better for your baby than infant formulas and are designed uniquely for your baby’s needs. °· Breast milk improves your baby's brain development. °· Your baby is less likely to develop other conditions, such as childhood obesity,   asthma, or type 2 diabetes mellitus. ° °For You °· Breastfeeding helps to create a very special bond between you and your baby. °· Breastfeeding is convenient. Breast milk is always available at the correct temperature and costs nothing. °· Breastfeeding helps to burn calories and helps you lose the weight gained during pregnancy. °· Breastfeeding makes your uterus contract to its prepregnancy size faster and slows bleeding (lochia) after you give birth. °· Breastfeeding helps to lower your risk of developing type 2 diabetes mellitus, osteoporosis, and breast or ovarian cancer later in life. ° °Signs that your baby is hungry °Early Signs of Hunger °· Increased alertness or activity. °· Stretching. °· Movement of the head from side to side. °· Movement of the head and opening of the mouth when the corner of the mouth or cheek is stroked (rooting). °· Increased sucking sounds, smacking lips, cooing, sighing, or  squeaking. °· Hand-to-mouth movements. °· Increased sucking of fingers or hands. ° °Late Signs of Hunger °· Fussing. °· Intermittent crying. ° °Extreme Signs of Hunger °Signs of extreme hunger will require calming and consoling before your baby will be able to breastfeed successfully. Do not wait for the following signs of extreme hunger to occur before you initiate breastfeeding: °· Restlessness. °· A loud, strong cry. °· Screaming. ° °Breastfeeding basics °Breastfeeding Initiation °· Find a comfortable place to sit or lie down, with your neck and back well supported. °· Place a pillow or rolled up blanket under your baby to bring him or her to the level of your breast (if you are seated). Nursing pillows are specially designed to help support your arms and your baby while you breastfeed. °· Make sure that your baby's abdomen is facing your abdomen. °· Gently massage your breast. With your fingertips, massage from your chest wall toward your nipple in a circular motion. This encourages milk flow. You may need to continue this action during the feeding if your milk flows slowly. °· Support your breast with 4 fingers underneath and your thumb above your nipple. Make sure your fingers are well away from your nipple and your baby’s mouth. °· Stroke your baby's lips gently with your finger or nipple. °· When your baby's mouth is open wide enough, quickly bring your baby to your breast, placing your entire nipple and as much of the colored area around your nipple (areola) as possible into your baby's mouth. °? More areola should be visible above your baby's upper lip than below the lower lip. °? Your baby's tongue should be between his or her lower gum and your breast. °· Ensure that your baby's mouth is correctly positioned around your nipple (latched). Your baby's lips should create a seal on your breast and be turned out (everted). °· It is common for your baby to suck about 2-3 minutes in order to start the flow of  breast milk. ° °Latching °Teaching your baby how to latch on to your breast properly is very important. An improper latch can cause nipple pain and decreased milk supply for you and poor weight gain in your baby. Also, if your baby is not latched onto your nipple properly, he or she may swallow some air during feeding. This can make your baby fussy. Burping your baby when you switch breasts during the feeding can help to get rid of the air. However, teaching your baby to latch on properly is still the best way to prevent fussiness from swallowing air while breastfeeding. °Signs that your baby has successfully   latched on to your nipple: °· Silent tugging or silent sucking, without causing you pain. °· Swallowing heard between every 3-4 sucks. °· Muscle movement above and in front of his or her ears while sucking. ° °Signs that your baby has not successfully latched on to nipple: °· Sucking sounds or smacking sounds from your baby while breastfeeding. °· Nipple pain. ° °If you think your baby has not latched on correctly, slip your finger into the corner of your baby’s mouth to break the suction and place it between your baby's gums. Attempt breastfeeding initiation again. °Signs of Successful Breastfeeding °Signs from your baby: °· A gradual decrease in the number of sucks or complete cessation of sucking. °· Falling asleep. °· Relaxation of his or her body. °· Retention of a small amount of milk in his or her mouth. °· Letting go of your breast by himself or herself. ° °Signs from you: °· Breasts that have increased in firmness, weight, and size 1-3 hours after feeding. °· Breasts that are softer immediately after breastfeeding. °· Increased milk volume, as well as a change in milk consistency and color by the fifth day of breastfeeding. °· Nipples that are not sore, cracked, or bleeding. ° °Signs That Your Baby is Getting Enough Milk °· Wetting at least 1-2 diapers during the first 24 hours after birth. °· Wetting  at least 5-6 diapers every 24 hours for the first week after birth. The urine should be clear or pale yellow by 5 days after birth. °· Wetting 6-8 diapers every 24 hours as your baby continues to grow and develop. °· At least 3 stools in a 24-hour period by age 5 days. The stool should be soft and yellow. °· At least 3 stools in a 24-hour period by age 7 days. The stool should be seedy and yellow. °· No loss of weight greater than 10% of birth weight during the first 3 days of age. °· Average weight gain of 4-7 ounces (113-198 g) per week after age 4 days. °· Consistent daily weight gain by age 5 days, without weight loss after the age of 2 weeks. ° °After a feeding, your baby may spit up a small amount. This is common. °Breastfeeding frequency and duration °Frequent feeding will help you make more milk and can prevent sore nipples and breast engorgement. Breastfeed when you feel the need to reduce the fullness of your breasts or when your baby shows signs of hunger. This is called "breastfeeding on demand." Avoid introducing a pacifier to your baby while you are working to establish breastfeeding (the first 4-6 weeks after your baby is born). After this time you may choose to use a pacifier. Research has shown that pacifier use during the first year of a baby's life decreases the risk of sudden infant death syndrome (SIDS). °Allow your baby to feed on each breast as long as he or she wants. Breastfeed until your baby is finished feeding. When your baby unlatches or falls asleep while feeding from the first breast, offer the second breast. Because newborns are often sleepy in the first few weeks of life, you may need to awaken your baby to get him or her to feed. °Breastfeeding times will vary from baby to baby. However, the following rules can serve as a guide to help you ensure that your baby is properly fed: °· Newborns (babies 4 weeks of age or younger) may breastfeed every 1-3 hours. °· Newborns should not go  longer than 3 hours during   the day or 5 hours during the night without breastfeeding. °· You should breastfeed your baby a minimum of 8 times in a 24-hour period until you begin to introduce solid foods to your baby at around 6 months of age. ° °Breast milk pumping °Pumping and storing breast milk allows you to ensure that your baby is exclusively fed your breast milk, even at times when you are unable to breastfeed. This is especially important if you are going back to work while you are still breastfeeding or when you are not able to be present during feedings. Your lactation consultant can give you guidelines on how long it is safe to store breast milk. °A breast pump is a machine that allows you to pump milk from your breast into a sterile bottle. The pumped breast milk can then be stored in a refrigerator or freezer. Some breast pumps are operated by hand, while others use electricity. Ask your lactation consultant which type will work best for you. Breast pumps can be purchased, but some hospitals and breastfeeding support groups lease breast pumps on a monthly basis. A lactation consultant can teach you how to hand express breast milk, if you prefer not to use a pump. °Caring for your breasts while you breastfeed °Nipples can become dry, cracked, and sore while breastfeeding. The following recommendations can help keep your breasts moisturized and healthy: °· Avoid using soap on your nipples. °· Wear a supportive bra. Although not required, special nursing bras and tank tops are designed to allow access to your breasts for breastfeeding without taking off your entire bra or top. Avoid wearing underwire-style bras or extremely tight bras. °· Air dry your nipples for 3-4 minutes after each feeding. °· Use only cotton bra pads to absorb leaked breast milk. Leaking of breast milk between feedings is normal. °· Use lanolin on your nipples after breastfeeding. Lanolin helps to maintain your skin's normal moisture  barrier. If you use pure lanolin, you do not need to wash it off before feeding your baby again. Pure lanolin is not toxic to your baby. You may also hand express a few drops of breast milk and gently massage that milk into your nipples and allow the milk to air dry. ° °In the first few weeks after giving birth, some women experience extremely full breasts (engorgement). Engorgement can make your breasts feel heavy, warm, and tender to the touch. Engorgement peaks within 3-5 days after you give birth. The following recommendations can help ease engorgement: °· Completely empty your breasts while breastfeeding or pumping. You may want to start by applying warm, moist heat (in the shower or with warm water-soaked hand towels) just before feeding or pumping. This increases circulation and helps the milk flow. If your baby does not completely empty your breasts while breastfeeding, pump any extra milk after he or she is finished. °· Wear a snug bra (nursing or regular) or tank top for 1-2 days to signal your body to slightly decrease milk production. °· Apply ice packs to your breasts, unless this is too uncomfortable for you. °· Make sure that your baby is latched on and positioned properly while breastfeeding. ° °If engorgement persists after 48 hours of following these recommendations, contact your health care provider or a lactation consultant. °Overall health care recommendations while breastfeeding °· Eat healthy foods. Alternate between meals and snacks, eating 3 of each per day. Because what you eat affects your breast milk, some of the foods may make your baby more irritable than   usual. Avoid eating these foods if you are sure that they are negatively affecting your baby. °· Drink milk, fruit juice, and water to satisfy your thirst (about 10 glasses a day). °· Rest often, relax, and continue to take your prenatal vitamins to prevent fatigue, stress, and anemia. °· Continue breast self-awareness checks. °· Avoid  chewing and smoking tobacco. Chemicals from cigarettes that pass into breast milk and exposure to secondhand smoke may harm your baby. °· Avoid alcohol and drug use, including marijuana. °Some medicines that may be harmful to your baby can pass through breast milk. It is important to ask your health care provider before taking any medicine, including all over-the-counter and prescription medicine as well as vitamin and herbal supplements. °It is possible to become pregnant while breastfeeding. If birth control is desired, ask your health care provider about options that will be safe for your baby. °Contact a health care provider if: °· You feel like you want to stop breastfeeding or have become frustrated with breastfeeding. °· You have painful breasts or nipples. °· Your nipples are cracked or bleeding. °· Your breasts are red, tender, or warm. °· You have a swollen area on either breast. °· You have a fever or chills. °· You have nausea or vomiting. °· You have drainage other than breast milk from your nipples. °· Your breasts do not become full before feedings by the fifth day after you give birth. °· You feel sad and depressed. °· Your baby is too sleepy to eat well. °· Your baby is having trouble sleeping. °· Your baby is wetting less than 3 diapers in a 24-hour period. °· Your baby has less than 3 stools in a 24-hour period. °· Your baby's skin or the white part of his or her eyes becomes yellow. °· Your baby is not gaining weight by 5 days of age. °Get help right away if: °· Your baby is overly tired (lethargic) and does not want to wake up and feed. °· Your baby develops an unexplained fever. °This information is not intended to replace advice given to you by your health care provider. Make sure you discuss any questions you have with your health care provider. °Document Released: 11/09/2005 Document Revised: 04/22/2016 Document Reviewed: 05/03/2013 °Elsevier Interactive Patient Education © 2017 [employer]  [employer] ° ° °

## 2016-12-06 NOTE — Progress Notes (Signed)
Post Partum Day 1 Subjective: no complaints  Objective: Blood pressure 120/80, pulse 67, temperature 98 F (36.7 C), temperature source Oral, resp. rate 16, height 5\' 5"  (1.651 m), weight 158 lb (71.7 kg), last menstrual period 12/03/2015, SpO2 100 %, unknown if currently breastfeeding.  Physical Exam:  General: alert and cooperative Lochia: appropriate Uterine Fundus: firm Incision: n/a DVT Evaluation: No evidence of DVT seen on physical exam.   Recent Labs  12/05/16 0951 12/06/16 0716  HGB 11.0* 9.3*  HCT 34.3* 28.6*    Assessment/Plan: Plan for discharge tomorrow   LOS: 2 days   SCHERMERHORN,THOMAS 12/06/2016, 12:00 PM

## 2016-12-07 ENCOUNTER — Encounter: Payer: Self-pay | Admitting: Obstetrics & Gynecology

## 2016-12-07 DIAGNOSIS — D696 Thrombocytopenia, unspecified: Secondary | ICD-10-CM | POA: Diagnosis not present

## 2016-12-07 MED ORDER — IBUPROFEN 600 MG PO TABS
600.0000 mg | ORAL_TABLET | Freq: Four times a day (QID) | ORAL | Status: DC
  Administered 2016-12-07: 600 mg via ORAL
  Filled 2016-12-07: qty 1

## 2016-12-07 MED ORDER — INFLUENZA VAC SPLIT QUAD 0.5 ML IM SUSY: 0.5000 mL | PREFILLED_SYRINGE | INTRAMUSCULAR | Status: DC

## 2016-12-07 MED ORDER — IBUPROFEN 600 MG PO TABS: 600.0000 mg | ORAL_TABLET | Freq: Four times a day (QID) | ORAL | 0 refills | Status: DC

## 2016-12-07 NOTE — Progress Notes (Signed)
Patient came to her door and started into the hallway stating that she felt like she couldn't breathe. She was crying and appeared very anxious as her husband and mother-in-law assisted her back to her room. The patient has a sinus infection and is currently taking medication for congestion and cough.  She has been crying and has gotten very congested and states she feels she can't breathe through her nose but can breathe ok through her mouth. RN paged on-call physician, but patients husband said she has her own nose spray (Affrin) with her and has been using it for a while now so she just wanted to use that for now. After reviewing d/c instructions with her the patient stated that she felt ok now and just wanted to go home.

## 2016-12-07 NOTE — Lactation Note (Signed)
This note was copied from a baby's chart. Lactation Consultation Note  Patient Name: Regina [name] Today's Date: 12/07/2016 Reason for consult: Initial assessment;Breast/nipple pain Mom c/o not knowing if baby latching and nursing well enough.  QS diaper count; moist mouth; VSS' weight loss only 3%; no other risk factors. I reviewed signs of well nourished baby in her booklet and then pointed out what to look for during each feeding. She has positional stripes across both nipples from poor latch as well as bruise above left areola from missed latch. I reviewed what she she expect to see, feel, and hear when baby is well latched and nutritively nursing. She had been using awkward position and her fingers had been placed on areola forcing baby to latch onto nipple instead of breast. Once we addressed these issues, Regina [name] started to nurse much better with a lot more swallows. I helped a lot on left breast, and then had the parents do everything themselves on the other side. I reviewed basic BF teachings and encouraged her to attend [place] for more breastfeeding support.   Maternal Data Formula Feeding for Exclusion: No Has patient been taught Hand Expression?: Yes Does the patient have breastfeeding experience prior to this delivery?: No  Feeding Feeding Type: Breast Fed Length of feed: 40 min  LATCH Score/Interventions Latch: Grasps breast easily, tongue down, lips flanged, rhythmical sucking. (taught dad how to unfurl bottom lip for better seal) Intervention(s): Adjust position;Assist with latch;Breast massage (taught mom to keep her fingers off areola; deep latch)  Audible Swallowing: A few with stimulation Intervention(s): Hand expression  Type of Nipple: Everted at rest and after stimulation  Comfort (Breast/Nipple): Filling, red/small blisters or bruises, mild/mod discomfort  Problem noted: Cracked, bleeding, blisters, bruises (positional stripes acroos both nipples; on  areola) Interventions  (Cracked/bleeding/bruising/blister): Expressed breast milk to nipple (corrected latch)  Hold (Positioning): Assistance needed to correctly position infant at breast and maintain latch. Intervention(s): Support Pillows (taught football hold)  LATCH Score: 7  Lactation Tools Discussed/Used     Consult Status Consult Status: PRN (gave her [place] contact info; [facility] info)    Roque Cash 12/07/2016, 11:34 AM

## 2016-12-07 NOTE — Progress Notes (Signed)
D/C order from MD.  Reviewed d/c instructions and prescriptions with patient and answered any questions.  Patient d/c home with infant via wheelchair by nursing/auxillary. 

## 2016-12-07 NOTE — Discharge Summary (Signed)
Obstetric Discharge Summary Date of admission: 12/04/16 Date of discharge 12/07/16 Date of delivery: 12/05/16 Patient's last menstrual period was 12/03/2015 (approximate).   Reason for Admission: rupture of membranes, meconium Prenatal Procedures: none Intrapartum Procedures: spontaneous vaginal delivery, prolong ROM  Postpartum Procedures: none Complications-Operative and Postpartum: second degree perineal laceration Hemoglobin  Date Value Ref Range Status  12/06/2016 9.3 (L) 12.0 - 16.0 g/dL Final   HCT  Date Value Ref Range Status  12/06/2016 28.6 (L) 35.0 - 47.0 % Final  BP 132/73 (BP Location: Right Arm)   Pulse 60   Temp 98 F (36.7 C) (Oral)   Resp 20   Ht 5\' 5"  (1.651 m)   Wt 71.7 kg (158 lb)   LMP 12/03/2015 (Approximate)   SpO2 100%   Breastfeeding? Unknown   BMI 26.29 kg/m    Physical Exam:  General: alert and cooperative Lochia: appropriate Uterine Fundus: firm Incision: healing well DVT Evaluation: No evidence of DVT seen on physical exam.  Discharge Diagnoses: Term Pregnancy-delivered  Discharge Information: Date: 12/07/2016 Activity: pelvic rest Diet: routine Medications: Ibuprofen Condition: stable Instructions: refer to practice specific booklet Discharge to: home Follow-up Information    SCHERMERHORN,THOMAS, MD. Schedule an appointment as soon as possible for a visit in 6 week(s).   Specialty:  Obstetrics and Gynecology Why:  please call [facility] to make your own 6 week follow up appointment Contact information: [place] [phone]           Newborn Data: Live born female  Birth Weight: 6 lb 4.5 oz (2850 g) APGAR: 4, 7  Home with mother.  Chelsea C Ward 12/07/2016, 8:29 AM

## 2017-01-23 ENCOUNTER — Encounter: Payer: Self-pay | Admitting: Emergency Medicine

## 2017-01-23 ENCOUNTER — Observation Stay: Payer: BC Managed Care – PPO | Admitting: Certified Registered Nurse Anesthetist

## 2017-01-23 ENCOUNTER — Encounter
Admission: EM | Disposition: A | Discharge status: Home / Self Care | Payer: Self-pay | Source: Home / Self Care | Attending: Emergency Medicine

## 2017-01-23 ENCOUNTER — Observation Stay
Admission: EM | Admit: 2017-01-23 | Discharge: 2017-01-23 | Disposition: A | Discharge status: Home / Self Care | Payer: BC Managed Care – PPO | Attending: Obstetrics and Gynecology | Admitting: Obstetrics and Gynecology

## 2017-01-23 ENCOUNTER — Emergency Department: Payer: BC Managed Care – PPO

## 2017-01-23 DIAGNOSIS — R102 Pelvic and perineal pain: Secondary | ICD-10-CM | POA: Diagnosis present

## 2017-01-23 DIAGNOSIS — O8823 Thromboembolism in the puerperium: Secondary | ICD-10-CM | POA: Diagnosis not present

## 2017-01-23 DIAGNOSIS — F909 Attention-deficit hyperactivity disorder, unspecified type: Secondary | ICD-10-CM | POA: Diagnosis not present

## 2017-01-23 DIAGNOSIS — F411 Generalized anxiety disorder: Secondary | ICD-10-CM | POA: Insufficient documentation

## 2017-01-23 HISTORY — PX: DILATION AND EVACUATION: SHX1459

## 2017-01-23 LAB — BASIC METABOLIC PANEL
ANION GAP: 8 (ref 5–15)
BUN: 19 mg/dL (ref 6–20)
CO2: 23 mmol/L (ref 22–32)
Calcium: 8.7 mg/dL — ABNORMAL LOW (ref 8.9–10.3)
Chloride: 107 mmol/L (ref 101–111)
Creatinine, Ser: 1.03 mg/dL — ABNORMAL HIGH (ref 0.44–1.00)
GFR calc Af Amer: >60 mL/min (ref >60)
Glucose, Bld: 135 mg/dL — ABNORMAL HIGH (ref 65–99)
POTASSIUM: 3.6 mmol/L (ref 3.5–5.1)
SODIUM: 138 mmol/L (ref 135–145)

## 2017-01-23 LAB — CBC
HEMATOCRIT: 36.3 % (ref 35.0–47.0)
HEMOGLOBIN: 11.7 g/dL — AB (ref 12.0–16.0)
MCH: 27.1 pg (ref 26.0–34.0)
MCHC: 32.3 g/dL (ref 32.0–36.0)
MCV: 84 fL (ref 80.0–100.0)
Platelets: 235 10*3/uL (ref 150–440)
RBC: 4.32 MIL/uL (ref 3.80–5.20)
RDW: 19.2 % — AB (ref 11.5–14.5)
WBC: 10.8 10*3/uL (ref 3.6–11.0)

## 2017-01-23 LAB — URINALYSIS, COMPLETE (UACMP) WITH MICROSCOPIC
BACTERIA UA: NONE SEEN
SPECIFIC GRAVITY, URINE: 1.033 — AB (ref 1.005–1.030)
Squamous Epithelial / LPF: NONE SEEN

## 2017-01-23 LAB — TYPE AND SCREEN
ABO/RH(D): B POS
Antibody Screen: NEGATIVE

## 2017-01-23 LAB — HCG, QUANTITATIVE, PREGNANCY: HCG, BETA CHAIN, QUANT, S: 4 m[IU]/mL (ref <5)

## 2017-01-23 SURGERY — DILATION AND EVACUATION, UTERUS
Anesthesia: General | Site: Vagina | Wound class: Clean Contaminated

## 2017-01-23 MED ORDER — ONDANSETRON HCL 4 MG/2ML IJ SOLN
INTRAMUSCULAR | Status: AC
  Filled 2017-01-23: qty 2

## 2017-01-23 MED ORDER — DEXAMETHASONE SODIUM PHOSPHATE 10 MG/ML IJ SOLN
INTRAMUSCULAR | Status: AC
  Filled 2017-01-23: qty 1

## 2017-01-23 MED ORDER — FENTANYL CITRATE (PF) 100 MCG/2ML IJ SOLN
INTRAMUSCULAR | Status: DC | PRN
  Administered 2017-01-23: 50 ug via INTRAVENOUS

## 2017-01-23 MED ORDER — LIDOCAINE HCL (PF) 2 % IJ SOLN
INTRAMUSCULAR | Status: AC
  Filled 2017-01-23: qty 2

## 2017-01-23 MED ORDER — LIDOCAINE HCL (CARDIAC) 20 MG/ML IV SOLN
INTRAVENOUS | Status: DC | PRN
  Administered 2017-01-23: 50 mg via INTRAVENOUS

## 2017-01-23 MED ORDER — PROPOFOL 10 MG/ML IV BOLUS
INTRAVENOUS | Status: AC
  Filled 2017-01-23: qty 20

## 2017-01-23 MED ORDER — PROMETHAZINE HCL 25 MG/ML IJ SOLN: 6.2500 mg | INTRAMUSCULAR | Status: DC | PRN

## 2017-01-23 MED ORDER — MIDAZOLAM HCL 2 MG/2ML IJ SOLN
INTRAMUSCULAR | Status: AC
  Filled 2017-01-23: qty 2

## 2017-01-23 MED ORDER — KETOROLAC TROMETHAMINE 30 MG/ML IJ SOLN
INTRAMUSCULAR | Status: AC
  Filled 2017-01-23: qty 1

## 2017-01-23 MED ORDER — ACETAMINOPHEN 10 MG/ML IV SOLN
INTRAVENOUS | Status: AC
  Filled 2017-01-23: qty 100

## 2017-01-23 MED ORDER — SODIUM CHLORIDE 0.9 % IV SOLN
INTRAVENOUS | Status: DC | PRN
  Administered 2017-01-23: 22:00:00 via INTRAVENOUS

## 2017-01-23 MED ORDER — ACETAMINOPHEN 10 MG/ML IV SOLN
INTRAVENOUS | Status: DC | PRN
  Administered 2017-01-23: 1000 mg via INTRAVENOUS

## 2017-01-23 MED ORDER — FENTANYL CITRATE (PF) 100 MCG/2ML IJ SOLN
INTRAMUSCULAR | Status: AC
  Filled 2017-01-23: qty 2

## 2017-01-23 MED ORDER — FENTANYL CITRATE (PF) 100 MCG/2ML IJ SOLN: 25.0000 ug | INTRAMUSCULAR | Status: DC | PRN

## 2017-01-23 MED ORDER — METHYLERGONOVINE MALEATE 0.2 MG/ML IJ SOLN
0.2000 mg | Freq: Once | INTRAMUSCULAR | Status: AC
  Administered 2017-01-23: 0.2 mg via INTRAMUSCULAR
  Filled 2017-01-23: qty 1

## 2017-01-23 MED ORDER — CEFOXITIN SODIUM-DEXTROSE 2-2.2 GM-% IV SOLR (PREMIX)
2.0000 g | Freq: Four times a day (QID) | INTRAVENOUS | Status: DC
  Administered 2017-01-23: 2 g via INTRAVENOUS
  Filled 2017-01-23 (×3): qty 50

## 2017-01-23 MED ORDER — SODIUM CHLORIDE 0.9 % IV BOLUS (SEPSIS)
1000.0000 mL | Freq: Once | INTRAVENOUS | Status: AC
Start: 2017-01-23 — End: 2017-01-23
  Administered 2017-01-23: 1000 mL via INTRAVENOUS

## 2017-01-23 MED ORDER — MIDAZOLAM HCL 2 MG/2ML IJ SOLN
INTRAMUSCULAR | Status: DC | PRN
  Administered 2017-01-23: 2 mg via INTRAVENOUS

## 2017-01-23 MED ORDER — ONDANSETRON HCL 4 MG/2ML IJ SOLN
INTRAMUSCULAR | Status: DC | PRN
  Administered 2017-01-23: 4 mg via INTRAVENOUS

## 2017-01-23 MED ORDER — PROPOFOL 10 MG/ML IV BOLUS
INTRAVENOUS | Status: DC | PRN
  Administered 2017-01-23: 150 mg via INTRAVENOUS

## 2017-01-23 MED ORDER — DEXAMETHASONE SODIUM PHOSPHATE 10 MG/ML IJ SOLN
INTRAMUSCULAR | Status: DC | PRN
  Administered 2017-01-23: 10 mg via INTRAVENOUS

## 2017-01-23 MED ORDER — METHYLERGONOVINE MALEATE 0.2 MG/ML IJ SOLN
INTRAMUSCULAR | Status: AC
  Administered 2017-01-23: 0.2 mg via INTRAMUSCULAR
  Filled 2017-01-23: qty 1

## 2017-01-23 MED ORDER — DEXTROSE 5 % IV SOLN
2.0000 g | Freq: Four times a day (QID) | INTRAVENOUS | Status: DC
  Filled 2017-01-23 (×3): qty 2

## 2017-01-23 MED ORDER — KETOROLAC TROMETHAMINE 30 MG/ML IJ SOLN
INTRAMUSCULAR | Status: DC | PRN
  Administered 2017-01-23: 30 mg via INTRAVENOUS

## 2017-01-23 SURGICAL SUPPLY — 20 items
CATH ROBINSON RED A/P 16FR (CATHETERS) ×3 IMPLANT
FILTER UTR ASPR SPEC (MISCELLANEOUS) ×1 IMPLANT
FLTR UTR ASPR SPEC (MISCELLANEOUS) ×3
GLOVE BIO SURGEON STRL SZ8 (GLOVE) ×3 IMPLANT
GOWN STRL REUS W/ TWL LRG LVL3 (GOWN DISPOSABLE) ×1 IMPLANT
GOWN STRL REUS W/ TWL XL LVL3 (GOWN DISPOSABLE) ×1 IMPLANT
GOWN STRL REUS W/TWL LRG LVL3 (GOWN DISPOSABLE) ×2
GOWN STRL REUS W/TWL XL LVL3 (GOWN DISPOSABLE) ×2
KIT RM TURNOVER CYSTO AR (KITS) ×3 IMPLANT
PACK DNC HYST (MISCELLANEOUS) ×3 IMPLANT
PAD OB MATERNITY 4.3X12.25 (PERSONAL CARE ITEMS) ×3 IMPLANT
PAD PREP 24X41 OB/GYN DISP (PERSONAL CARE ITEMS) ×3 IMPLANT
SET BERKELEY SUCTION TUBING (SUCTIONS) ×3 IMPLANT
TOWEL OR 17X26 4PK STRL BLUE (TOWEL DISPOSABLE) ×3 IMPLANT
VACURETTE 10 RIGID CVD (CANNULA) ×3 IMPLANT
VACURETTE 6 ASPIR F TIP BERK (CANNULA) IMPLANT
VACURETTE 7MM F TIP (CANNULA)
VACURETTE 7MM F TIP STRL (CANNULA) IMPLANT
VACURETTE 8 RIGID CVD (CANNULA) IMPLANT
VACURETTE 8MM F TIP (MISCELLANEOUS) IMPLANT

## 2017-01-23 NOTE — Progress Notes (Signed)
pt discharged home with family.  Discharge instructions, prescriptions and follow up appointment given to and reviewed with pt.  Pt verbalized understanding, all questions answered.  Escorted by [employer]

## 2017-01-23 NOTE — Transfer of Care (Signed)
Immediate Anesthesia Transfer of Care Note  Patient: Regina [name]  Procedure(s) Performed: Procedure(s): DILATATION AND EVACUATION (N/A)  Patient Location: PACU  Anesthesia Type:General  Level of Consciousness: awake, alert  and oriented  Airway & Oxygen Therapy: Patient Spontanous Breathing and Patient connected to nasal cannula oxygen  Post-op Assessment: Report given to RN and Post -op Vital signs reviewed and stable  Post vital signs: Reviewed and stable  Last Vitals:  Vitals:   01/23/17 1830 01/23/17 2014  BP: (!) 123/93 130/81  Pulse: (!) 51 (!) 49  Resp: 13 15  Temp:  36.6 C    Last Pain:  Vitals:   01/23/17 2014  TempSrc: Oral  PainSc: 0-No pain         Complications: No apparent anesthesia complications

## 2017-01-23 NOTE — Brief Op Note (Signed)
01/23/2017  9:52 PM  PATIENT:  Regina [name]  36 y.o. female  PRE-OPERATIVE DIAGNOSIS:  retained POC  POST-OPERATIVE DIAGNOSIS:  Retained products of conception  PROCEDURE:   Suction dilation and curettage SURGEON:  Surgeon(s) and Role:    Boykin Nearing, MD - Primary  PHYSICIAN ASSISTANT: none  ASSISTANTS: none   ANESTHESIA:   LMA  EBL:  Total I/O In: -  Out: 25 [Urine:25]  BLOOD ADMINISTERED:none  DRAINS: none   LOCAL MEDICATIONS USED:  NONE  SPECIMEN:  Source of Specimen:  uterine retained products of conception  DISPOSITION OF SPECIMEN:  PATHOLOGY  COUNTS:  YES  TOURNIQUET:  * No tourniquets in log *  DICTATION: .Other Dictation: Dictation Number verbal  PLAN OF CARE: Discharge to home after PACU  PATIENT DISPOSITION:  PACU - hemodynamically stable.   Delay start of Pharmacological VTE agent (>24hrs) due to surgical blood loss or risk of bleeding: not applicable

## 2017-01-23 NOTE — Anesthesia Post-op Follow-up Note (Cosign Needed)
Anesthesia QCDR form completed.        

## 2017-01-23 NOTE — Anesthesia Procedure Notes (Signed)
Procedure Name: LMA Insertion Date/Time: 01/23/2017 9:37 PM Performed by: Johnna Acosta Pre-anesthesia Checklist: Patient identified, Emergency Drugs available, Suction available, Patient being monitored and Timeout performed Patient Re-evaluated:Patient Re-evaluated prior to inductionOxygen Delivery Method: Circle system utilized Preoxygenation: Pre-oxygenation with 100% oxygen Intubation Type: IV induction LMA: LMA inserted LMA Size: 3.0 Tube type: Oral Number of attempts: 1 Placement Confirmation: positive ETCO2 and breath sounds checked- equal and bilateral Tube secured with: Tape Dental Injury: Teeth and Oropharynx as per pre-operative assessment

## 2017-01-23 NOTE — ED Notes (Signed)
Pt undressed, sitting up on strecher, alert, speaking in complete sentences without difficulty. Pt is tearful at times, and states that she is nervous. Pt reports that she had a normal vaginal delivery, pt states that her post pardom bleeding had almost totally stopped and then started heavily this am passing large clots, pt states that she has never bled like this before with any cycles, pt states that she is bleeding through super plus tampons every 4-5 minutes. Pt states that she was uncertain of why this was happening and called her OB-GYN who told her to wait 4 hours to see if it slowed down. Pt states the bleeding didn't slow down and she started feeling weak and tingly.

## 2017-01-23 NOTE — Anesthesia Preprocedure Evaluation (Addendum)
Anesthesia Evaluation  Patient identified by MRN, date of birth, ID band Patient awake    Reviewed: Allergy & Precautions, NPO status , Patient's Chart, lab work & pertinent test results  History of Anesthesia Complications Negative for: history of anesthetic complications  Airway Mallampati: II       Dental no notable dental hx. (+) Teeth Intact   Pulmonary neg pulmonary ROS,           Cardiovascular Exercise Tolerance: Good negative cardio ROS       Neuro/Psych negative neurological ROS     GI/Hepatic negative GI ROS, Neg liver ROS,   Endo/Other  negative endocrine ROS  Renal/GU negative Renal ROS     Musculoskeletal   Abdominal   Peds  Hematology   Anesthesia Other Findings Past Medical History: No date: ADHD (attention deficit hyperactivity disorder) No date: Anxiety   Reproductive/Obstetrics                             Anesthesia Physical  Anesthesia Plan  ASA: II  Anesthesia Plan: General LMA   Post-op Pain Management:    Induction:   Airway Management Planned:   Additional Equipment:   Intra-op Plan:   Post-operative Plan:   Informed Consent: I have reviewed the patients History and Physical, chart, labs and discussed the procedure including the risks, benefits and alternatives for the proposed anesthesia with the patient or authorized representative who has indicated his/her understanding and acceptance.   Dental Advisory Given  Plan Discussed with: Anesthesiologist and CRNA  Anesthesia Plan Comments:         Anesthesia Quick Evaluation

## 2017-01-23 NOTE — ED Notes (Signed)
Pt asked if it would be ok for her to breast pump. After consulting with MD, notified that it would be ok if she wanted to do so. Inquired weather the pt had the breast pump with her or if she needed one. Pt stated she did not have her breast pump with her and had not fed her baby since about 10am; pt stated "my boobs feel like they're about to burst!" Informed pt that this RN will attempt to get patient access to a breast pump. Called L&D floor and inquired about availability of a breast pump; the nurse on the floor said they did have one and we have the patient use it. Asked Marcie Bal, ED Tech if she would go pick up the pump from the floor, and she said she would. Informed the pt that breast pump will be here shortly. Pt was thankful.

## 2017-01-23 NOTE — ED Notes (Signed)
Pt returned from Ultrasound.

## 2017-01-23 NOTE — Discharge Summary (Signed)
Physician Discharge Summary  Patient ID: Regina [name] MRN: [id number] DOB/AGE: May 29, 1981 36 y.o.  Admit date: 01/23/2017 Discharge date: 01/23/2017  Admission Diagnoses:uterine bleeding , retained products of conception  Discharge Diagnoses: same Active Problems:   Retained placenta or membranes   Discharged Condition: good Hospital Course:she underwent a suction and D+c for retained POC Consults: none Significant Diagnostic Studies:  Results for orders placed or performed during the hospital encounter of 01/23/17 (from the past 24 hour(s))  Basic metabolic panel     Status: Abnormal   Collection Time: 01/23/17  2:30 PM  Result Value Ref Range   Sodium 138 135 - 145 mmol/L   Potassium 3.6 3.5 - 5.1 mmol/L   Chloride 107 101 - 111 mmol/L   CO2 23 22 - 32 mmol/L   Glucose, Bld 135 (H) 65 - 99 mg/dL   BUN 19 6 - 20 mg/dL   Creatinine, Ser 1.03 (H) 0.44 - 1.00 mg/dL   Calcium 8.7 (L) 8.9 - 10.3 mg/dL   GFR calc non Af Amer >60 >60 mL/min   GFR calc Af Amer >60 >60 mL/min   Anion gap 8 5 - 15  CBC     Status: Abnormal   Collection Time: 01/23/17  2:30 PM  Result Value Ref Range   WBC 10.8 3.6 - 11.0 K/uL   RBC 4.32 3.80 - 5.20 MIL/uL   Hemoglobin 11.7 (L) 12.0 - 16.0 g/dL   HCT 36.3 35.0 - 47.0 %   MCV 84.0 80.0 - 100.0 fL   MCH 27.1 26.0 - 34.0 pg   MCHC 32.3 32.0 - 36.0 g/dL   RDW 19.2 (H) 11.5 - 14.5 %   Platelets 235 150 - 440 K/uL  Urinalysis, Complete w Microscopic     Status: Abnormal   Collection Time: 01/23/17  2:30 PM  Result Value Ref Range   Color, Urine RED (A) YELLOW   APPearance CLOUDY (A) CLEAR   Specific Gravity, Urine 1.033 (H) 1.005 - 1.030   pH  5.0 - 8.0    TEST NOT REPORTED DUE TO COLOR INTERFERENCE OF URINE PIGMENT   Glucose, UA (A) NEGATIVE mg/dL    TEST NOT REPORTED DUE TO COLOR INTERFERENCE OF URINE PIGMENT   Hgb urine dipstick (A) NEGATIVE    TEST NOT REPORTED DUE TO COLOR INTERFERENCE OF URINE PIGMENT   Bilirubin Urine (A) NEGATIVE    TEST  NOT REPORTED DUE TO COLOR INTERFERENCE OF URINE PIGMENT   Ketones, ur (A) NEGATIVE mg/dL    TEST NOT REPORTED DUE TO COLOR INTERFERENCE OF URINE PIGMENT   Protein, ur (A) NEGATIVE mg/dL    TEST NOT REPORTED DUE TO COLOR INTERFERENCE OF URINE PIGMENT   Nitrite (A) NEGATIVE    TEST NOT REPORTED DUE TO COLOR INTERFERENCE OF URINE PIGMENT   Leukocytes, UA (A) NEGATIVE    TEST NOT REPORTED DUE TO COLOR INTERFERENCE OF URINE PIGMENT   RBC / HPF TOO NUMEROUS TO COUNT 0 - 5 RBC/hpf   WBC, UA TOO NUMEROUS TO COUNT 0 - 5 WBC/hpf   Bacteria, UA NONE SEEN NONE SEEN   Squamous Epithelial / LPF NONE SEEN NONE SEEN   Mucous PRESENT   Type and screen [facility]     Status: None   Collection Time: 01/23/17  2:31 PM  Result Value Ref Range   ABO/RH(D) B POS    Antibody Screen NEG    Sample Expiration 01/26/2017   hCG, quantitative, pregnancy     Status:  None   Collection Time: 01/23/17  2:31 PM  Result Value Ref Range   hCG, Beta Chain, Quant, S 4 <5 mIU/mL   Treatments: surgery as above Discharge Exam: Blood pressure 140/77, pulse (!) 47, temperature 97.6 F (36.4 C), temperature source Oral, resp. rate 18, height 5\' 5"  (1.651 m), SpO2 100 %, unknown if currently breastfeeding. Abd: soft NT   Disposition: 01-Home or Self Care  Discharge Instructions    Call MD for:  difficulty breathing, headache or visual disturbances    Complete by:  As directed    Call MD for:  extreme fatigue    Complete by:  As directed    Call MD for:  hives    Complete by:  As directed    Call MD for:  persistant dizziness or light-headedness    Complete by:  As directed    Call MD for:  persistant nausea and vomiting    Complete by:  As directed    Call MD for:  redness, tenderness, or signs of infection (pain, swelling, redness, odor or green/yellow discharge around incision site)    Complete by:  As directed    Call MD for:  severe uncontrolled pain    Complete by:  As directed    Call  MD for:  temperature >100.4    Complete by:  As directed    Diet - low sodium heart healthy    Complete by:  As directed    Increase activity slowly    Complete by:  As directed      Allergies as of 01/23/2017   No Known Allergies     Medication List    TAKE these medications   ibuprofen 600 MG tablet Commonly known as:  ADVIL,MOTRIN Take 1 tablet (600 mg total) by mouth every 6 (six) hours.   oxymetazoline 0.05 % nasal spray Commonly known as:  AFRIN NASAL SPRAY Place 1 spray into both nostrils 2 (two) times daily. Use for 3 days only.   prenatal multivitamin Tabs tablet Take 1 tablet by mouth daily at 12 noon.   sertraline 100 MG tablet Commonly known as:  ZOLOFT Take 100 mg by mouth at bedtime.        Signed: Shandrea Lusk 01/23/2017, 11:42 PM

## 2017-01-23 NOTE — ED Triage Notes (Signed)
Pt reports deliver jan 13th, pt started period today, reports heavy vaginal bleeding. Pt reports tampon use every 4-6 minutes. Pt reports large blood clots. Pt reports dizziness, talked to schemerhorn, was told to come to ED.

## 2017-01-23 NOTE — ED Provider Notes (Signed)
[facility] Emergency Department Provider Note  ____________________________________________   First MD Initiated Contact with Patient 01/23/17 1427     (approximate)  I have reviewed the triage vital signs and the nursing notes.   HISTORY  Chief Complaint Vaginal Bleeding   HPI Regina [name] is a 36 y.o. female with a history of ADHD and anxiety who is presenting to the emergency department today after giving birth in mid January. She says that she is 7 weeks postpartum and started bleeding this morning. She said that she had maroon bleeding after the vaginal delivery and then this morning started having a brisk bright red bleeding with clots. She describes the clots as pancakes sized. Says that she is also felt some mild lightheadedness. Also with cramping across the lower abdomen. Discussed with her OB/GYN, Dr. Ouida Sills, who recommended she come to the emergency department for further evaluation.Patient denies being sexually active since giving birth. Patient is not on any blood thinners.   Past Medical History:  Diagnosis Date  . ADHD (attention deficit hyperactivity disorder)   . Anxiety     Patient Active Problem List   Diagnosis Date Noted  . Thrombocytopenia ([place]) 12/07/2016  . Rupture of membranes with meconium present 12/04/2016  . Generalized anxiety disorder 10/11/2016  . AMA (advanced maternal age) multigravida 27+, third trimester 10/11/2016  . In vitro fertilization 07/21/2016  . Supervision of high risk pregnancy in third trimester 06/23/2016  . 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  . Anxiety 07/26/2012  . 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 KNEE SURGERY      Prior to Admission medications   Medication Sig Start Date End Date Taking? Authorizing Provider  busPIRone (BUSPAR) 7.5 MG tablet  11/02/16   Historical Provider, MD  ferrous sulfate 325 (65  FE) MG EC tablet Take 325 mg by mouth 3 (three) times daily with meals.    Historical Provider, MD  guaiFENesin (MUCINEX) 600 MG 12 hr tablet Take by mouth 2 (two) times daily.    Historical Provider, MD  ibuprofen (ADVIL,MOTRIN) 600 MG tablet Take 1 tablet (600 mg total) by mouth every 6 (six) hours. 12/07/16   Honor Loh Ward, MD  LORazepam (ATIVAN) 0.5 MG tablet  10/27/16   Historical Provider, MD  oxymetazoline (AFRIN NASAL SPRAY) 0.05 % nasal spray Place 1 spray into both nostrils 2 (two) times daily. Use for 3 days only. 08/11/16   Amy Overton Mam, NP  Prenatal Vit-Fe Fumarate-FA (PRENATAL MULTIVITAMIN) TABS tablet Take 1 tablet by mouth daily at 12 noon.    Historical Provider, MD  ranitidine (ZANTAC) 300 MG tablet Take by mouth.    Historical Provider, MD  sertraline (ZOLOFT) 100 MG tablet  10/27/16   Historical Provider, MD    Allergies Patient has no known allergies.  Family History  Problem Relation Age of Onset  . Multiple sclerosis Mother   . Anxiety disorder Mother     Social History Social History  Substance Use Topics  . Smoking status: Never Smoker  . Smokeless tobacco: Never Used  . Alcohol use No    Review of Systems Constitutional: No fever/chills Eyes: No visual changes. ENT: No sore throat. Cardiovascular: Denies chest pain. Respiratory: Denies shortness of breath. Gastrointestinal: No nausea, no vomiting.  No diarrhea.  No constipation. Genitourinary: Negative for dysuria. Musculoskeletal: Negative for back pain. Skin: Negative for rash. Neurological: Negative for headaches, focal weakness or numbness.  10-point ROS  otherwise negative.  ____________________________________________   PHYSICAL EXAM:  VITAL SIGNS: ED Triage Vitals  Enc Vitals Group     BP 01/23/17 1422 (!) 93/41     Pulse Rate 01/23/17 1422 100     Resp 01/23/17 1422 16     Temp 01/23/17 1422 97.5 F (36.4 C)     Temp Source 01/23/17 1422 Oral     SpO2 01/23/17 1422 98 %      Weight --      Height 01/23/17 1423 5\' 5"  (1.651 m)     Head Circumference --      Peak Flow --      Pain Score 01/23/17 1423 2     Pain Loc --      Pain Edu? --      Excl. in [place]? --     Constitutional: Alert and oriented. Mildly anxious. Eyes: Conjunctivae are normal. PERRL. EOMI. Head: Atraumatic. Nose: No congestion/rhinnorhea. Mouth/Throat: Mucous membranes are moist.   Neck: No stridor.   Cardiovascular: Normal rate, regular rhythm. Grossly normal heart sounds.  Respiratory: Normal respiratory effort.  No retractions. Lungs CTAB. Gastrointestinal: Soft with mild tenderness across lower abdomen. No distention. No CVA tenderness. Genitourinary: No lesions on external exam. Speculum exam with maroon, oozing blood from the cervix. In the middle of the cervix is a cauliflower-like mass of tissue. Applied gentle traction on this with ring forceps and seemed to start to move/pull through the cervix. However, the mass went from about the size of a marble size of a golf ball and had still not pulled through. At this time I stopped pulling and it retracted into the uterus. The bleeding did not increase after attempted traction on this mass. Musculoskeletal: No lower extremity tenderness nor edema.  No joint effusions. Neurologic:  Normal speech and language. No gross focal neurologic deficits are appreciated.  Skin:  Skin is warm, dry and intact. No rash noted. Psychiatric: Mood and affect are normal. Speech and behavior are normal.  ____________________________________________   LABS (all labs ordered are listed, but only abnormal results are displayed)  Labs Reviewed  BASIC METABOLIC PANEL  CBC  URINALYSIS, COMPLETE (UACMP) WITH MICROSCOPIC  HCG, QUANTITATIVE, PREGNANCY  TYPE AND SCREEN   ____________________________________________  EKG   ____________________________________________  RADIOLOGY   ____________________________________________   PROCEDURES  Procedure(s)  performed:   Procedures  Critical Care performed:   ____________________________________________   INITIAL IMPRESSION / ASSESSMENT AND PLAN / ED COURSE  Pertinent labs & imaging results that were available during my care of the patient were reviewed by me and considered in my medical decision making (see chart for details).  ----------------------------------------- 3:12 PM on 01/23/2017 -----------------------------------------  Discussed case with Dr. Ouida Sills who recommends an ultrasound and then call him back after the options resulted. Patient offered but is not requesting any pain medication at this time.  We also reviewed the findings on the pelvic exam the need for ultrasound and then to be evaluated by the OB/GYN. Patient signed out to Dr. Archie Balboa.      ____________________________________________   FINAL CLINICAL IMPRESSION(S) / ED DIAGNOSES  Dysfunctional uterine bleeding.    NEW MEDICATIONS STARTED DURING THIS VISIT:  New Prescriptions   No medications on file     Note:  This document was prepared using Dragon voice recognition software and may include unintentional dictation errors.    Orbie Pyo, MD 01/23/17 [phone]

## 2017-01-23 NOTE — H&P (Signed)
Consult History and Physical   SERVICE: Gynecology  Patient Name: Regina [name] Patient MRN:   [id number]  CC/ HPI: G1P1 6.5 week out from an uncomplicated SVD  Pt developed heavy vaginal bleeding today with ++ clot passage . She is breast feeding and is not on contraception . Marland Kitchen Records from delivery have been reviewed .  ED consulted me for retained POC .  U/S done in ED today shows 3x3 cm endometrial mass c/w retained POC . She denies fever    Review of Systems: positives in bold GEN:  No  fevers, chills, weight changes, appetite changes, fatigue, night sweats HEENT: no HA, vision changes, hearing loss, congestion, rhinorrhea, sinus pressure, dysphagia CV:  No  CP, palpitations PULM: no  SOB, cough GI:  No abd pain, N/V/D/C GU: no dysuria, urgency, frequency, Bleeding today  MSK: no arthralgias, myalgias, back pain, swelling, + SKIN:  rashes, color changes, pallor NEURO:  numbness, weakness, tingling, seizures, dizziness, tremors PSYCH: + ADHD + anxiety  HEME/LYMPH:  easy bruising or bleeding ENDO: no  heat/cold intolerance  Past Obstetrical History: SVD x1   Past Gynecologic History: No LMP recorded. Past Medical History: Past Medical History:  Diagnosis Date  . ADHD (attention deficit hyperactivity disorder)   . Anxiety     Past Surgical History:   Past Surgical History:  Procedure Laterality Date  . KNEE SURGERY      Family History:  family history includes Anxiety disorder in her mother; Multiple sclerosis in her mother.  Social History:  Social History   Social History  . Marital status: Married    Spouse name: N/A  . Number of children: N/A  . Years of education: N/A   Occupational History  . Not on file.   Social History Main Topics  . Smoking status: Never Smoker  . Smokeless tobacco: Never Used  . Alcohol use No  . Drug use: No  . Sexual activity: Not Currently   Other Topics Concern  . Not on file   Social History Narrative  . No  narrative on file    Home Medications:  Medications reconciled in EPIC  No current facility-administered medications on file prior to encounter.    Current Outpatient Prescriptions on File Prior to Encounter  Medication Sig Dispense Refill  . busPIRone (BUSPAR) 7.5 MG tablet     . ferrous sulfate 325 (65 FE) MG EC tablet Take 325 mg by mouth 3 (three) times daily with meals.    Marland Kitchen guaiFENesin (MUCINEX) 600 MG 12 hr tablet Take by mouth 2 (two) times daily.    Marland Kitchen ibuprofen (ADVIL,MOTRIN) 600 MG tablet Take 1 tablet (600 mg total) by mouth every 6 (six) hours. 30 tablet 0  . LORazepam (ATIVAN) 0.5 MG tablet     . oxymetazoline (AFRIN NASAL SPRAY) 0.05 % nasal spray Place 1 spray into both nostrils 2 (two) times daily. Use for 3 days only. 30 mL 0  . Prenatal Vit-Fe Fumarate-FA (PRENATAL MULTIVITAMIN) TABS tablet Take 1 tablet by mouth daily at 12 noon.    . ranitidine (ZANTAC) 300 MG tablet Take by mouth.    . sertraline (ZOLOFT) 100 MG tablet       Allergies:  No Known Allergies  Physical Exam:  Temp:  [97.5 F (36.4 C)] 97.5 F (36.4 C) (03/03 1422) Pulse Rate:  [54-100] 54 (03/03 1808) Resp:  [16-17] 17 (03/03 1808) BP: (93-119)/(41-92) 119/92 (03/03 1808) SpO2:  [98 %-100 %] 100 % (03/03 1808)   General Appearance:  Well developed, well nourished, no acute distress, alert and oriented x3 HEENT:  Normocephalic atraumatic, extraocular movements intact, moist mucous membranes Cardiovascular:  Normal S1/S2, regular rate and rhythm, no murmurs Pulmonary:  clear to auscultation, no wheezes, rales or rhonchi, symmetric air entry, good air exchange Abdomen:  Bowel sounds present, soft, nontender, nondistended, no abnormal masses, no epigastric pain Extremities:  Full range of motion, no pedal edema, 2+ distal pulses, no tenderness Skin:  normal coloration and turgor, no rashes, no suspicious skin lesions noted  Neurologic:  Cranial nerves 2-12 grossly intact, normal muscle tone,  strength 5/5 all four extremities Psychiatric:  Normal mood and affect, appropriate, no AH/VH Pelvic: deferred  Labs/Studies:   CBC and Coags:  Lab Results  Component Value Date   WBC 10.8 01/23/2017   HGB 11.7 (L) 01/23/2017   HCT 36.3 01/23/2017   MCV 84.0 01/23/2017   PLT 235 01/23/2017   CMP:  Lab Results  Component Value Date   NA 138 01/23/2017   K 3.6 01/23/2017   CL 107 01/23/2017   CO2 23 01/23/2017   BUN 19 01/23/2017   CREATININE 1.03 (H) 01/23/2017   Other Labs:   TVUS:  Other Imaging: US Transvaginal Non-ob  Result Date: 01/23/2017 CLINICAL DATA:  Vaginal bleeding for 1 day.  One month postpartum. EXAM: TRANSABDOMINAL AND TRANSVAGINAL ULTRASOUND OF PELVIS DOPPLER ULTRASOUND OF OVARIES TECHNIQUE: Both transabdominal and transvaginal ultrasound examinations of the pelvis were performed. Transabdominal technique was performed for global imaging of the pelvis including uterus, ovaries, adnexal regions, and pelvic cul-de-sac. It was necessary to proceed with endovaginal exam following the transabdominal exam to visualize the uterus, ovaries, and adnexa. Color and duplex Doppler ultrasound was utilized to evaluate blood flow to the ovaries. COMPARISON:  None. FINDINGS: Uterus Measurements: 7.0 x 4.7 x 6.5 cm. Endometrium Thickness: 5 mm superiorly. Within the centrally uterine cervix region an avascular "Mass" measures 2.7 x 2.8 x 3.3 cm. Example image 81. Right ovary Measurements: 3.0 x 1.4 x 1.5 cm. Normal appearance/no adnexal mass. Left ovary Measurements: 3.1 by 1.2 x 2.2 cm. Normal appearance/no adnexal mass. Pulsed Doppler evaluation of both ovaries demonstrates normal low-resistance arterial and venous waveforms. Other findings Trace free pelvic fluid is likely physiologic. IMPRESSION: 1. Hyperechoic "Mass" in the central uterine cervix is favored to represent a blood clot, given the clinical history. No specific evidence of retained products of conception no endometrial  thickening more superiorly. Depending on clinical concern, potential clinical strategies include followup ultrasound at 2-3 weeks versus direct endocervical visualization. 2.  Trace free pelvic fluid is likely physiologic. Electronically Signed   By: Abigail Miyamoto M.D.   On: 01/23/2017 17:15   US Pelvis Complete  Result Date: 01/23/2017 CLINICAL DATA:  Vaginal bleeding for 1 day.  One month postpartum. EXAM: TRANSABDOMINAL AND TRANSVAGINAL ULTRASOUND OF PELVIS DOPPLER ULTRASOUND OF OVARIES TECHNIQUE: Both transabdominal and transvaginal ultrasound examinations of the pelvis were performed. Transabdominal technique was performed for global imaging of the pelvis including uterus, ovaries, adnexal regions, and pelvic cul-de-sac. It was necessary to proceed with endovaginal exam following the transabdominal exam to visualize the uterus, ovaries, and adnexa. Color and duplex Doppler ultrasound was utilized to evaluate blood flow to the ovaries. COMPARISON:  None. FINDINGS: Uterus Measurements: 7.0 x 4.7 x 6.5 cm. Endometrium Thickness: 5 mm superiorly. Within the centrally uterine cervix region an avascular "Mass" measures 2.7 x 2.8 x 3.3 cm. Example image 81. Right ovary Measurements: 3.0 x 1.4 x 1.5 cm. Normal appearance/no  adnexal mass. Left ovary Measurements: 3.1 by 1.2 x 2.2 cm. Normal appearance/no adnexal mass. Pulsed Doppler evaluation of both ovaries demonstrates normal low-resistance arterial and venous waveforms. Other findings Trace free pelvic fluid is likely physiologic. IMPRESSION: 1. Hyperechoic "Mass" in the central uterine cervix is favored to represent a blood clot, given the clinical history. No specific evidence of retained products of conception no endometrial thickening more superiorly. Depending on clinical concern, potential clinical strategies include followup ultrasound at 2-3 weeks versus direct endocervical visualization. 2.  Trace free pelvic fluid is likely physiologic. Electronically  Signed   By: Abigail Miyamoto M.D.   On: 01/23/2017 17:15   [place] Art/ven Flow Abd Pelv Doppler  Result Date: 01/23/2017 CLINICAL DATA:  Vaginal bleeding for 1 day.  One month postpartum. EXAM: TRANSABDOMINAL AND TRANSVAGINAL ULTRASOUND OF PELVIS DOPPLER ULTRASOUND OF OVARIES TECHNIQUE: Both transabdominal and transvaginal ultrasound examinations of the pelvis were performed. Transabdominal technique was performed for global imaging of the pelvis including uterus, ovaries, adnexal regions, and pelvic cul-de-sac. It was necessary to proceed with endovaginal exam following the transabdominal exam to visualize the uterus, ovaries, and adnexa. Color and duplex Doppler ultrasound was utilized to evaluate blood flow to the ovaries. COMPARISON:  None. FINDINGS: Uterus Measurements: 7.0 x 4.7 x 6.5 cm. Endometrium Thickness: 5 mm superiorly. Within the centrally uterine cervix region an avascular "Mass" measures 2.7 x 2.8 x 3.3 cm. Example image 81. Right ovary Measurements: 3.0 x 1.4 x 1.5 cm. Normal appearance/no adnexal mass. Left ovary Measurements: 3.1 by 1.2 x 2.2 cm. Normal appearance/no adnexal mass. Pulsed Doppler evaluation of both ovaries demonstrates normal low-resistance arterial and venous waveforms. Other findings Trace free pelvic fluid is likely physiologic. IMPRESSION: 1. Hyperechoic "Mass" in the central uterine cervix is favored to represent a blood clot, given the clinical history. No specific evidence of retained products of conception no endometrial thickening more superiorly. Depending on clinical concern, potential clinical strategies include followup ultrasound at 2-3 weeks versus direct endocervical visualization. 2.  Trace free pelvic fluid is likely physiologic. Electronically Signed   By: Abigail Miyamoto M.D.   On: 01/23/2017 17:15     Assessment / Plan:   Regina [name] is a 36 y.o. G2P1001 who presents with retained products of conception and heavy uterine bleeding   Plan : will take the pt to  the OR for suction dilation and curettage to remove POC  Pt has been consented for the possible risks of the procedure and risks of blood transfusion   Thank you for the opportunity to be involved with this pt's care.

## 2017-01-24 NOTE — Op Note (Deleted)
  The note originally documented on this encounter has been moved the the encounter in which it belongs.  

## 2017-01-24 NOTE — Op Note (Addendum)
Regina, [name] NO.:  [id number]  MEDICAL RECORD NO.:  [id number]  LOCATION:                               FACILITY:  [facility]  PHYSICIAN:  Laverta Baltimore, MDDATE OF BIRTH:  September 29, 1981  DATE OF PROCEDURE: DATE OF DISCHARGE:  01/23/2017                              OPERATIVE REPORT   PREOPERATIVE DIAGNOSIS:  Retained products of conception.  POSTOPERATIVE DIAGNOSIS:  Retained products of conception.  PROCEDURE:  Suction, dilation, and curettage.  ANESTHESIA:  LMA.  SURGEON:  Laverta Baltimore, MD  INDICATIONS:  A 36 year old, gravida 1, para 1, status post spontaneous vaginal delivery 6-1/2 weeks ago.  The patient developed significant heavy uterine bleeding today with passage of large clots and cramping. Ultrasound in the emergency department demonstrated tissue within the uterus consistent with retained products of conception.  DESCRIPTION OF PROCEDURE:  After adequate LMA anesthesia, the patient was placed in dorsal supine position with legs in the candy-cane stirrups.  The patient received 2 g of IV cefoxitin prior to commencement of the case.  The patient was draped in normal sterile fashion.  Time-out was performed.  Straight catheterization of the bladder yielded 25 mL of concentrated urine.  A weighted speculum was placed in the posterior vaginal vault and single-tooth tenaculum was placed on the anterior cervix.  The tissue extruding from the cervical os was noted.  The cervix was already dilated to a #20 Hanks dilator. Tissue presenting at the cervical os was removed with ring forceps and #10 rigid curved suction cannula was then placed into the endometrial cavity.  A large bolus of tissue was then brought down to the cervical os, which was also removed with ring forceps.  Sharp curettage was performed throughout.  Good uterine cry.  Good hemostasis was noted. The single-tooth tenaculum was removed and silver nitrate sticks  were applied to control bleeding at the tenaculum site.  There were no complications.  ESTIMATED BLOOD LOSS:  20 mL.  FLUIDS:  100 mL.  URINE OUTPUT:  25 mL.  DISPOSITION:  The patient was taken to recovery room in good condition.          ______________________________ Laverta Baltimore, MD     TS/MEDQ  D:  01/23/2017  T:  01/24/2017  Job:  [id number]

## 2017-01-25 ENCOUNTER — Encounter: Payer: Self-pay | Admitting: Obstetrics and Gynecology

## 2017-01-25 NOTE — Anesthesia Postprocedure Evaluation (Signed)
Anesthesia Post Note  Patient: Regina [name]  Procedure(s) Performed: Procedure(s) (LRB): DILATATION AND EVACUATION (N/A)  Patient location during evaluation: PACU Anesthesia Type: General Level of consciousness: awake and alert Pain management: pain level controlled Vital Signs Assessment: post-procedure vital signs reviewed and stable Respiratory status: spontaneous breathing, nonlabored ventilation, respiratory function stable and patient connected to nasal cannula oxygen Cardiovascular status: blood pressure returned to baseline and stable Postop Assessment: no signs of nausea or vomiting Anesthetic complications: no     Last Vitals:  Vitals:   01/23/17 2252 01/23/17 2312  BP:  140/77  Pulse: (!) 47 (!) 47  Resp: (!) 22 18  Temp: 36.6 C 36.4 C    Last Pain:  Vitals:   01/23/17 2312  TempSrc: Oral  PainSc: 0-No pain                 Martha Clan

## 2017-01-26 LAB — SURGICAL PATHOLOGY

## 2018-02-04 ENCOUNTER — Encounter: Payer: Self-pay | Admitting: Nurse Practitioner

## 2018-02-04 ENCOUNTER — Other Ambulatory Visit: Payer: Self-pay

## 2018-02-04 ENCOUNTER — Ambulatory Visit: Payer: BC Managed Care – PPO | Admitting: Nurse Practitioner

## 2018-02-04 VITALS — BP 116/79 | HR 84 | Temp 98.7°F | Resp 20 | Ht 65.0 in | Wt 136.4 lb

## 2018-02-04 DIAGNOSIS — R509 Fever, unspecified: Secondary | ICD-10-CM

## 2018-02-04 DIAGNOSIS — J0140 Acute pansinusitis, unspecified: Secondary | ICD-10-CM

## 2018-02-04 LAB — POCT INFLUENZA A/B
Influenza A, POC: NEGATIVE
Influenza B, POC: NEGATIVE

## 2018-02-04 LAB — POCT RAPID STREP A (OFFICE): Rapid Strep A Screen: NEGATIVE

## 2018-02-04 MED ORDER — AMOXICILLIN-POT CLAVULANATE 875-125 MG PO TABS: 1.0000 | ORAL_TABLET | Freq: Two times a day (BID) | ORAL | 0 refills | Status: AC

## 2018-02-04 MED ORDER — TRIAMCINOLONE ACETONIDE 55 MCG/ACT NA AERO: 2.0000 | INHALATION_SPRAY | Freq: Every day | NASAL | 12 refills | Status: AC

## 2018-02-04 NOTE — Progress Notes (Signed)
Subjective:    Patient ID: Regina [name], female    DOB: [birth year], 37 y.o.   MRN: [id number]  Regina [name] is a 37 y.o. female presenting on 02/04/2018 for Sore Throat (cough, congestion, bodyaches w/ exposure to strep throat x6 days  )   HPI Sore Throat Pt has had cough, congestion, bodyaches and exposure to strep for the last 6 days.  Has felt poorly since Saturday.  Congestion nasal congestion, sore throat, low-grade fever, full body aches last night, cold sweats, malaise.  Symptoms have worsened over the last 2-3 days. 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  Has a 38 yr old at home the patient is concerned for taking something contagious home.   OTC meds tried to date: Sudafed - not working - instead took [place] is loosening mucous - now expectorating yellow/brown.  Patient feels "drunk" when taking Mucinex twice daily, so is only taking once a day Dayquil and Nyquil with some relief Headache med today with some relief  Social History   Tobacco Use  . Smoking status: Never Smoker  . Smokeless tobacco: Never Used  Substance Use Topics  . Alcohol use: No    Alcohol/week: 0.0 oz  . Drug use: No    Review of Systems Per HPI unless specifically indicated above     Objective:    BP 116/79 (BP Location: Right Arm, Patient Position: Sitting, Cuff Size: Normal)   Pulse 84   Temp 98.7 F (37.1 C) (Oral)   Resp 20   Ht 5\' 5"  (1.651 m)   Wt 136 lb 6.4 oz (61.9 kg)   SpO2 98%   BMI 22.70 kg/m   Wt Readings from Last 3 Encounters:  02/04/18 136 lb 6.4 oz (61.9 kg)  12/04/16 158 lb (71.7 kg)  08/11/16 149 lb 9.6 oz (67.9 kg)    Physical Exam  Constitutional: She appears well-developed and well-nourished. She appears distressed (mildly).  HENT:  Head: Normocephalic and atraumatic.  Right Ear: Hearing, tympanic membrane, external ear and ear canal normal.  Left Ear: Hearing, tympanic membrane, external ear and ear canal normal.  Nose:  Mucosal edema and rhinorrhea present. Right sinus exhibits maxillary sinus tenderness and frontal sinus tenderness. Left sinus exhibits maxillary sinus tenderness and frontal sinus tenderness.  Mouth/Throat: Uvula is midline and mucous membranes are normal. Posterior oropharyngeal edema (cobblestoning) present. Oropharyngeal exudate: clear secretions.  Neck: Normal range of motion. Neck supple.  Cardiovascular: Normal rate, regular rhythm, S1 normal, S2 normal, normal heart sounds and intact distal pulses.  Pulmonary/Chest: Effort normal and breath sounds normal. No respiratory distress.  Lymphadenopathy:    She has cervical adenopathy.  Neurological: She is alert.  Skin: Skin is warm and dry.  Psychiatric: She has a normal mood and affect. Her behavior is normal.  Vitals reviewed.  Results for orders placed or performed during the hospital encounter of 53/29/92  Basic metabolic panel  Result Value Ref Range   Sodium 138 135 - 145 mmol/L   Potassium 3.6 3.5 - 5.1 mmol/L   Chloride 107 101 - 111 mmol/L   CO2 23 22 - 32 mmol/L   Glucose, Bld 135 (H) 65 - 99 mg/dL   BUN 19 6 - 20 mg/dL   Creatinine, Ser 1.03 (H) 0.44 - 1.00 mg/dL   Calcium 8.7 (L) 8.9 - 10.3 mg/dL   GFR calc non Af Amer >60 >60 mL/min   GFR calc Af Amer >60 >60 mL/min  Anion gap 8 5 - 15  CBC  Result Value Ref Range   WBC 10.8 3.6 - 11.0 K/uL   RBC 4.32 3.80 - 5.20 MIL/uL   Hemoglobin 11.7 (L) 12.0 - 16.0 g/dL   HCT 36.3 35.0 - 47.0 %   MCV 84.0 80.0 - 100.0 fL   MCH 27.1 26.0 - 34.0 pg   MCHC 32.3 32.0 - 36.0 g/dL   RDW 19.2 (H) 11.5 - 14.5 %   Platelets 235 150 - 440 K/uL      Assessment & Plan:   Problem List Items Addressed This Visit    None    Visit Diagnoses    Fever and chills    -  Primary   Relevant Orders   POCT rapid strep A   POCT Influenza A/B   Acute non-recurrent pansinusitis       Relevant Medications   Pseudoephedrine-APAP-DM (DAYQUIL PO)   dextromethorphan-guaiFENesin (MUCINEX DM)  30-600 MG 12hr tablet   triamcinolone (NASACORT ALLERGY 24HR) 55 MCG/ACT AERO nasal inhaler   amoxicillin-clavulanate (AUGMENTIN) 875-125 MG tablet    Consistent with URI and secondary sinusitis with symptoms worsening over the past 7 days and initial symptoms of nasal congestion and sinus pressure over 2 weeks ago.   Plan: 1.START taking Augmentin 975-125 mg tablets every 12 hours for 10 days.  Discussed completing antibiotic. - While on antibiotic, take a probiotic OTC or from food. - Start Atrovent nasal spray decongestant 2 sprays each nostril up to 4 times daily for 5-7 days - Start anti-histamine loratadine 10mg  daily. - Can use Nasacort 2 sprays each nostril daily for up to 4-6 weeks if no epistaxis. - Start Mucinex-DM OTC for  7-10 days prn congestion 2. Supportive care with nasal saline, warm herbal tea with honey, 3. Improve hydration 4. Tylenol / Motrin PRN fevers  5. Return criteria given    Meds ordered this encounter  Medications  . triamcinolone (NASACORT ALLERGY 24HR) 55 MCG/ACT AERO nasal inhaler    Sig: Place 2 sprays into the nose daily.    Dispense:  1 Inhaler    Refill:  12    Order Specific Question:   Supervising Provider    Answer:   Olin Hauser [[id number]]  . amoxicillin-clavulanate (AUGMENTIN) 875-125 MG tablet    Sig: Take 1 tablet by mouth 2 (two) times daily for 10 days.    Dispense:  20 tablet    Refill:  0    Order Specific Question:   Supervising Provider    Answer:   Olin Hauser [[id number]]      Follow up plan: Return 5-7 days if symptoms worsen or fail to improve.  Cassell Smiles, DNP, AGPCNP-BC Adult Gerontology Primary Care Nurse Practitioner [facility] 02/04/2018, 12:10 PM

## 2018-02-04 NOTE — Patient Instructions (Addendum)
Regina [name], Thank you for coming in to clinic today.  1. It sounds like you have an Upper Respiratory Bacterial infection - Sinusitis.  Recommend good hand washing. - START taking Augmentin 875-125 mg one tablet twice daily for 10 days.  Make sure to take all doses of your antibiotic. - While you are on an antibiotic, take a probiotic.  Antibiotics kill good and bad bacteria.  A probiotic helps to replace your good bacteria. Probiotic pills can be found over the counter.  One brand is Florastor, but you can use any brand you prefer.  You can also get good bacteria from foods.  These foods are yogurt, kefir, kombucha, and fresh, refrigerated and uncooked sauerkraut. - Drink plenty of fluids.  - Start anti-histamine loratadine 10mg  daily. - You may also use NasoCort 2 sprays each nostril daily for up to 4-6 weeks  Other over the counter medications you may try, if needed for symptoms are: - If congestion is worse, start OTC Mucinex (or may try Mucinex-DM for cough) up to 7-10 days then stop - You may try over the counter Nasal Saline spray (Simply Saline, Ocean Spray) as needed to reduce congestion. - Start taking Tylenol extra strength 1 to 2 tablets every 6-8 hours for aches or fever/chills for next few days as needed.  Do not take more than 3,000 mg in 24 hours from all medicines.  You may also take ibuprofen 200-400mg  every 8 hours as needed.   - Drink warm herbal tea with honey for sore throat.   If symptoms are significantly worse with persistent fevers/chills despite tylenol/ibpurofen, nausea, vomiting unable to tolerate food/fluids or medicine, body aches, or shortness of breath, sinus pain pressure or worsening productive cough, then follow-up for re-evaluation, may seek more immediate care at Urgent Care or the ED if you are more concerned that it is an emergency.  Please schedule a follow-up appointment with Cassell Smiles, AGNP. Return 5-7 days if symptoms worsen or fail to improve.  If you  have any other questions or concerns, please feel free to call the clinic or send a message through [place]. You may also schedule an earlier appointment if necessary.  You will receive a survey after today's visit either digitally by e-mail or paper by [employer]. Your experiences and feedback matter to [place].  Please respond so we know how we are doing as we provide care for you.   Cassell Smiles, DNP, AGNP-BC Adult Gerontology Nurse Practitioner [place]

## 2018-07-08 ENCOUNTER — Other Ambulatory Visit: Payer: Self-pay | Admitting: Obstetrics and Gynecology

## 2018-07-08 DIAGNOSIS — Z1231 Encounter for screening mammogram for malignant neoplasm of breast: Secondary | ICD-10-CM

## 2018-08-05 ENCOUNTER — Ambulatory Visit
Admission: RE | Admit: 2018-08-05 | Discharge: 2018-08-05 | Disposition: A | Discharge status: Home / Self Care | Payer: BC Managed Care – PPO | Source: Ambulatory Visit | Attending: Obstetrics and Gynecology | Admitting: Obstetrics and Gynecology

## 2018-08-05 ENCOUNTER — Encounter: Payer: Self-pay | Admitting: Radiology

## 2018-08-05 DIAGNOSIS — Z1231 Encounter for screening mammogram for malignant neoplasm of breast: Secondary | ICD-10-CM | POA: Insufficient documentation

## 2019-05-11 ENCOUNTER — Encounter: Payer: Self-pay | Admitting: Family Medicine

## 2019-05-11 ENCOUNTER — Ambulatory Visit: Payer: BC Managed Care – PPO | Admitting: Family Medicine

## 2019-05-11 ENCOUNTER — Other Ambulatory Visit: Payer: Self-pay

## 2019-05-11 VITALS — BP 113/79 | HR 62 | Temp 98.3°F | Ht 65.0 in | Wt 142.6 lb

## 2019-05-11 DIAGNOSIS — M2142 Flat foot [pes planus] (acquired), left foot: Secondary | ICD-10-CM

## 2019-05-11 DIAGNOSIS — M79671 Pain in right foot: Secondary | ICD-10-CM | POA: Diagnosis not present

## 2019-05-11 DIAGNOSIS — M2141 Flat foot [pes planus] (acquired), right foot: Secondary | ICD-10-CM

## 2019-05-11 DIAGNOSIS — M2140 Flat foot [pes planus] (acquired), unspecified foot: Secondary | ICD-10-CM | POA: Insufficient documentation

## 2019-05-11 NOTE — Progress Notes (Signed)
Subjective:    Patient ID: Regina [name], female    DOB: Jul 23, 1981, 38 y.o.   MRN: [id number]  Regina [name] is a 38 y.o. female presenting on 05/11/2019 for Foot Pain (constant Rt foot pain mostly in the arch of the foot. The pain worsen when she applies pressure x 5-6 days. She admits that she has a bunion on that foot and unsure if the pain could be related.  Podiarty appt schedued for 05/23/19.)  PCP is Cassell Smiles, AGPCNP-BC - I am currently covering during her maternity leave.   HPI   Right Foot Pain - Reports history of R foot bunion, and Right foot arch pain, onset 5-6 days ago was sore initially (but not following a work out) then acute worsening - She is very active, doing regular exercise with home exercise regimen with low impact (due to knee and still low impact on foot) and cardio / weight lifting - Tried Tylenol, icing it and avoiding higher impact - but not resolved - Now today she improved somewhat overall, less pain but still has pain with movement with foot flexion and ambulation weightbearing is worse pain, she admits often history of compensating for her Left knee she will put more strain on R leg and sometimes limp or favor it more. - Already scheduled with [facility] Dr Elvina Mattes Podiatry - She has remote history unsure which foot, had Plantar fasciitis in one foot more in heel, feels different >4-5 years ago. Denies any injury, trauma, bruising, swelling redness numbness tingling    No flowsheet data found.  Social History   Tobacco Use  . Smoking status: Never Smoker  . Smokeless tobacco: Never Used  Substance Use Topics  . Alcohol use: No    Alcohol/week: 0.0 standard drinks  . Drug use: No    Review of Systems Per HPI unless specifically indicated above     Objective:    BP 113/79 (BP Location: Left Arm, Patient Position: Sitting, Cuff Size: Normal)   Pulse 62   Temp 98.3 F (36.8 C) (Oral)   Ht 5\' 5"  (1.651 m)   Wt 142 lb 9.6 oz (64.7 kg)    BMI 23.73 kg/m   Wt Readings from Last 3 Encounters:  05/11/19 142 lb 9.6 oz (64.7 kg)  02/04/18 136 lb 6.4 oz (61.9 kg)  12/04/16 158 lb (71.7 kg)    Physical Exam Vitals signs and nursing note reviewed.  Constitutional:      General: She is not in acute distress.    Appearance: She is well-developed. She is not diaphoretic.     Comments: Well-appearing, comfortable, cooperative  HENT:     Head: Normocephalic and atraumatic.  Eyes:     General:        Right eye: No discharge.        Left eye: No discharge.     Conjunctiva/sclera: Conjunctivae normal.  Cardiovascular:     Rate and Rhythm: Normal rate.  Pulmonary:     Effort: Pulmonary effort is normal.  Musculoskeletal:     Comments: Feet Inspection: bilaterally with deformity having bunion great toe, R>L, bilateral loss of medial foot arch with pes planus and increased pronation of feet symmetrical. No ecchymosis or erythema or edema.  Palpation: localized tender to palpation medial midfoot arch, not bony tenderness, ankle and heel achilles toes MTP all intact without tenderness  ROM: full active normal. Dorsiflexion with increased midfoot pain Strength: 5/5 intact ankle dorsi/planar flex Neurovascular: distal intact   Skin:  General: Skin is warm and dry.     Findings: No erythema or rash.  Neurological:     Mental Status: She is alert and oriented to person, place, and time.  Psychiatric:        Behavior: Behavior normal.     Comments: Well groomed, good eye contact, normal speech and thoughts        Assessment & Plan:   Problem List Items Addressed This Visit    Flat foot    Other Visit Diagnoses    Acute foot pain, right    -  Primary   Foot arch pain, right          Clinically consistent with acute R mid foot arch pain, seems to be mostly localized with significant pes planus bilateral and bunions with loss of medial arch concerning can be acute on chronic flare up of problem - Most likely a foot  muscle strain vs tendonitis, uncertain why acute trigger now but seems underlying chronic foot anatomical problems contributing - Active exercising now without significant changes, but mostly low impact exercises - Cannot rule out stress injury - No evidence of fracture or bony tenderness or other abnormality Improved on conservative therapy History of plantar fasciitis, but clinically different it seems  Plan: 1. Reassurance - agree with current therapy, will hold off and defer NSAID rx or orthotic at this time, she is already scheduled w/ Dr Elvina Mattes [facility] Podiatry, recommend discussing custom orthotic or other supportive device for medial foot arch and discuss bunion as well likely - functional foot pain now flared up - Can continue Tylenol, NSAID, RICE therapy as needed, relative rest, avoid reinjury  Handout given foot arch exercises Follow up if not improved, return to podiatry  No orders of the defined types were placed in this encounter.   Follow up plan: Return in about 4 weeks (around 06/08/2019), or if symptoms worsen or fail to improve, for foot pain.  Nobie Putnam, [facility] 05/11/2019, 11:35 AM

## 2019-05-11 NOTE — Patient Instructions (Addendum)
Thank you for coming to the office today.  Follow up with Dr Elvina Mattes on 6/30 - ask about insoles for your arch and other supportive devices that can help prevent future flare up  Likely inflammation or flare up can be a tendonitis or even a muscle / strain as well, I dont think that you have a fracture or tendon tear or acute injury  This can be related indirectly to the bunion  May warrant anti inflammatory  Recommend to start taking Tylenol Extra Strength 500mg  tabs - take 1 to 2 tabs per dose (max 1000mg ) every 6-8 hours for pain (take regularly, don't skip a dose for next 7 days), max 24 hour daily dose is 6 tablets or 3000mg . In the future you can repeat the same everyday Tylenol course for 1-2 weeks at a time.  - This is safe to take with anti-inflammatory medicines (Ibuprofen, Advil, Naproxen, Aleve, Meloxicam, Mobic)  Can use icing as well and relative rest, avoid re injury   Please schedule a Follow-up Appointment to: Return in about 4 weeks (around 06/08/2019), or if symptoms worsen or fail to improve, for foot pain.  If you have any other questions or concerns, please feel free to call the office or send a message through [place]. You may also schedule an earlier appointment if necessary.  Additionally, you may be receiving a survey about your experience at our office within a few days to 1 week by e-mail or mail. We value your feedback.  Regina Putnam, DO [facility], [facility]             Arch Pain Rehabilitation Exercises   Stretching: You may begin exercising the muscles of your foot right away by gently stretching them with the towel stretch. When the towel stretch becomes too easy, you may begin doing the standing calf stretch and plantar fascia stretch.  Towel stretch: Sit on a hard surface with your injured leg stretched out in front of you. Loop a towel around the ball of your foot and pull the towel toward your body keeping your knee  straight. Hold this position for 15 to 30 seconds then relax. Repeat 3 times. Standing calf stretch: Facing a wall, put your hands against the wall at about eye level. Keep the injured leg back, the uninjured leg forward, and the heel of your injured leg on the floor. Turn your injured foot slightly inward (as if you were pigeon-toed) as you slowly lean into the wall until you feel a stretch in the back of your calf. Hold for 15 to 30 seconds. Repeat 3 times. Do this exercise several times each day. When you can stand comfortably on your injured foot, you can begin stretching the plantar fascia at the bottom of your foot.  Plantar fascia stretch: Stand with the ball of your injured foot on a stair. Reach for the bottom step with your heel until you feel a stretch in the arch of your foot. Hold this position for 15 to 30 seconds and then relax. Repeat 3 times. Static and dynamic balance exercises  Place a chair next to your non-injured leg and stand upright. (This will provide you with balance if needed.) Stand on your injured foot. Try to raise the arch of your foot while keeping your toes on the floor. Try to maintain this position and balance on your injured side for 30 seconds. This exercise can be made more difficult by doing it on a piece of foam or a  pillow, or with your eyes closed.  Stand in the same position as above. Keep your foot in this position and reach forward in front of you with your injured side's hand, allowing your knee to bend. Repeat this 10 times while maintaining the arch height. This exercise can be made more difficult by reaching farther in front of you. Do 2 sets.  Stand in the same position as above. While maintaining your arch height, reach the injured side's hand across your body toward the chair. The farther you reach, the more challenging the exercise. Do 2 sets of 10.  Towel pickup: With your heel on the ground, pick up a towel with your toes. Release. Repeat 10 to 20  times. When this gets easy, add more resistance by placing a book or small weight on the towel.  Frozen can roll: Roll your bare injured foot back and forth from your heel to your mid-arch over a frozen juice can. Repeat for 3 to 5 minutes. This exercise is particularly helpful if done first thing in the morning. Resisted dorsiflexion: Sit with your injured leg out straight and your foot facing a doorway. Tie a loop in one end of the tubing. Put your foot through the loop so that the tubing goes around the arch of your foot. Tie a knot in the other end of the tubing and shut the knot in the door. Move backward until there is tension in the tubing. Keeping your knee straight, pull your foot toward your body, stretching the tubing. Slowly return to the starting position. Do 3 sets of 10.  Next, you can begin strengthening the muscles of your foot and lower leg by doing the rest of the exercises.  Strengthening Resisted plantar flexion: Sit with your leg outstretched and loop the middle section of the tubing around the ball of your foot. Hold the ends of the tubing in both hands. Gently press the ball of your foot down and point your toes, stretching the tubing. Return to the starting position. Do 3 sets of 10.  Resisted inversion: Sit with your legs out straight and cross your uninjured leg over your injured ankle. Wrap the tubing around the ball of your injured foot and then loop it around your uninjured foot so that the tubing is anchored there at one end. Hold the other end of the tubing in your hand. Turn your injured foot inward and upward. This will stretch the tubing. Return to the starting position. Do 3 sets of 10.  Resisted eversion: Sit with both legs stretched out in front of you, with your feet about a shoulder's width apart. Tie a loop in one end of the tubing. Put your injured foot through the loop so that the tubing goes around the arch of that foot and wraps around the outside of the  uninjured foot. Hold onto the other end of the tubing with your hand to provide tension. Turn your injured foot up and out. Make sure you keep your uninjured foot still so that it will allow the tubing to stretch as you move your injured foot. Return to the starting position. Do 3 sets of 10.

## 2019-07-11 LAB — HM PAP SMEAR

## 2019-07-12 ENCOUNTER — Other Ambulatory Visit: Payer: Self-pay | Admitting: Obstetrics and Gynecology

## 2019-07-12 DIAGNOSIS — Z1231 Encounter for screening mammogram for malignant neoplasm of breast: Secondary | ICD-10-CM

## 2019-08-15 ENCOUNTER — Ambulatory Visit
Admission: RE | Admit: 2019-08-15 | Discharge: 2019-08-15 | Disposition: A | Discharge status: Home / Self Care | Payer: BC Managed Care – PPO | Source: Ambulatory Visit | Attending: Obstetrics and Gynecology | Admitting: Obstetrics and Gynecology

## 2019-08-15 DIAGNOSIS — Z1231 Encounter for screening mammogram for malignant neoplasm of breast: Secondary | ICD-10-CM | POA: Insufficient documentation

## 2020-02-27 ENCOUNTER — Ambulatory Visit (INDEPENDENT_AMBULATORY_CARE_PROVIDER_SITE_OTHER): Payer: Self-pay

## 2020-02-27 ENCOUNTER — Other Ambulatory Visit: Payer: Self-pay

## 2020-02-27 DIAGNOSIS — I781 Nevus, non-neoplastic: Secondary | ICD-10-CM

## 2020-02-27 DIAGNOSIS — L719 Rosacea, unspecified: Secondary | ICD-10-CM

## 2020-02-27 DIAGNOSIS — L578 Other skin changes due to chronic exposure to nonionizing radiation: Secondary | ICD-10-CM

## 2020-02-27 DIAGNOSIS — Z872 Personal history of diseases of the skin and subcutaneous tissue: Secondary | ICD-10-CM

## 2020-04-03 IMAGING — MG MM DIGITAL SCREENING BILAT W/ TOMO W/ CAD
6 of 10 series · 6 of 30 positions shown · non-contrast
Comparison: Previous exam(s).

CLINICAL DATA: Screening.

EXAM:
DIGITAL SCREENING BILATERAL MAMMOGRAM WITH TOMO AND CAD

[L MLO synth-2D]
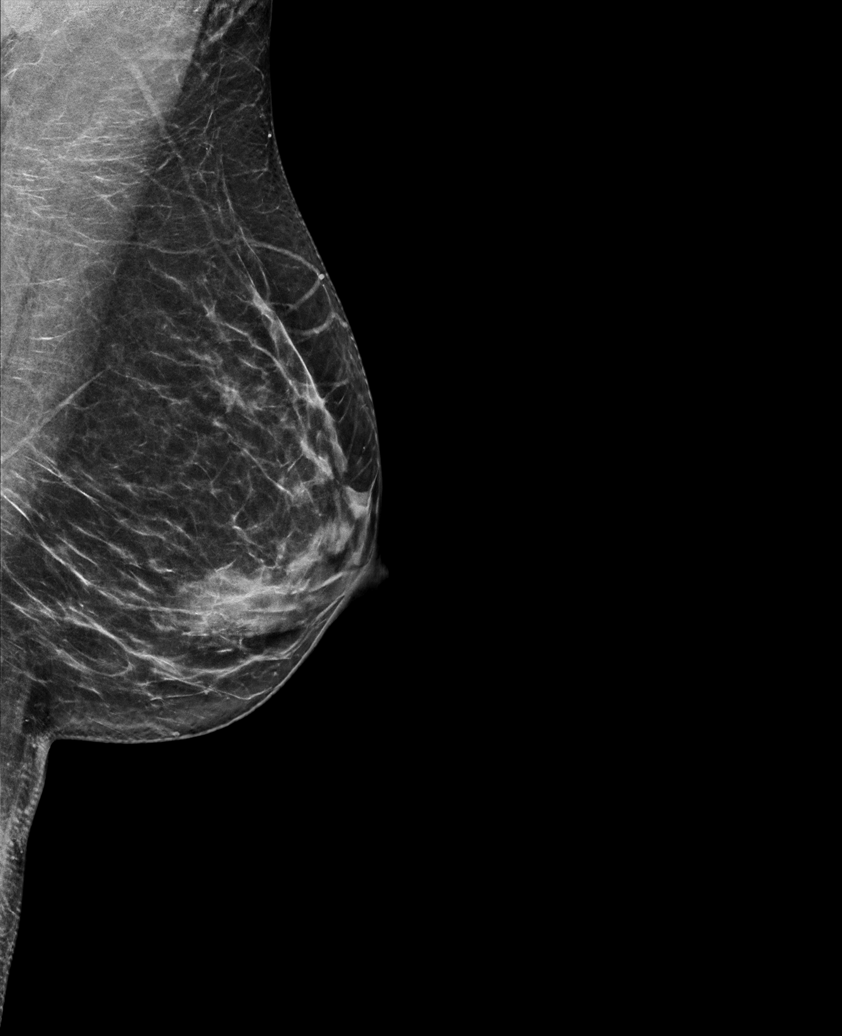

[L CC synth-2D]
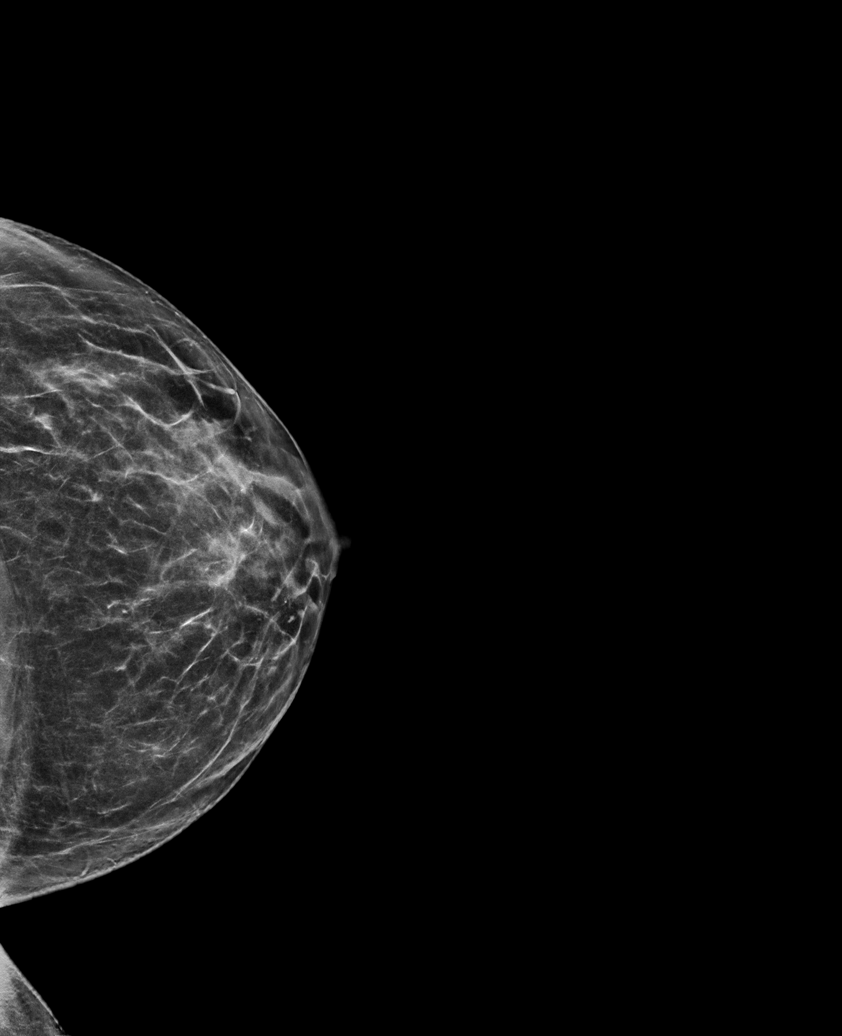

[R CC synth-2D]
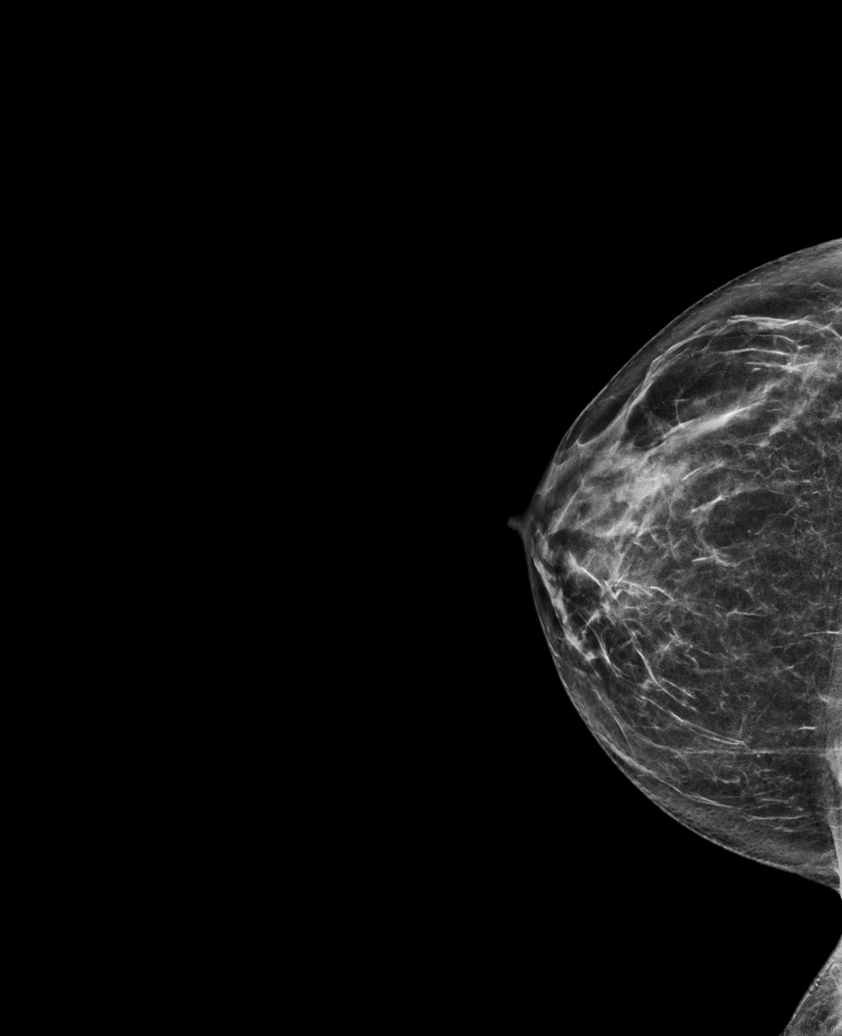

[R MLO synth-2D]
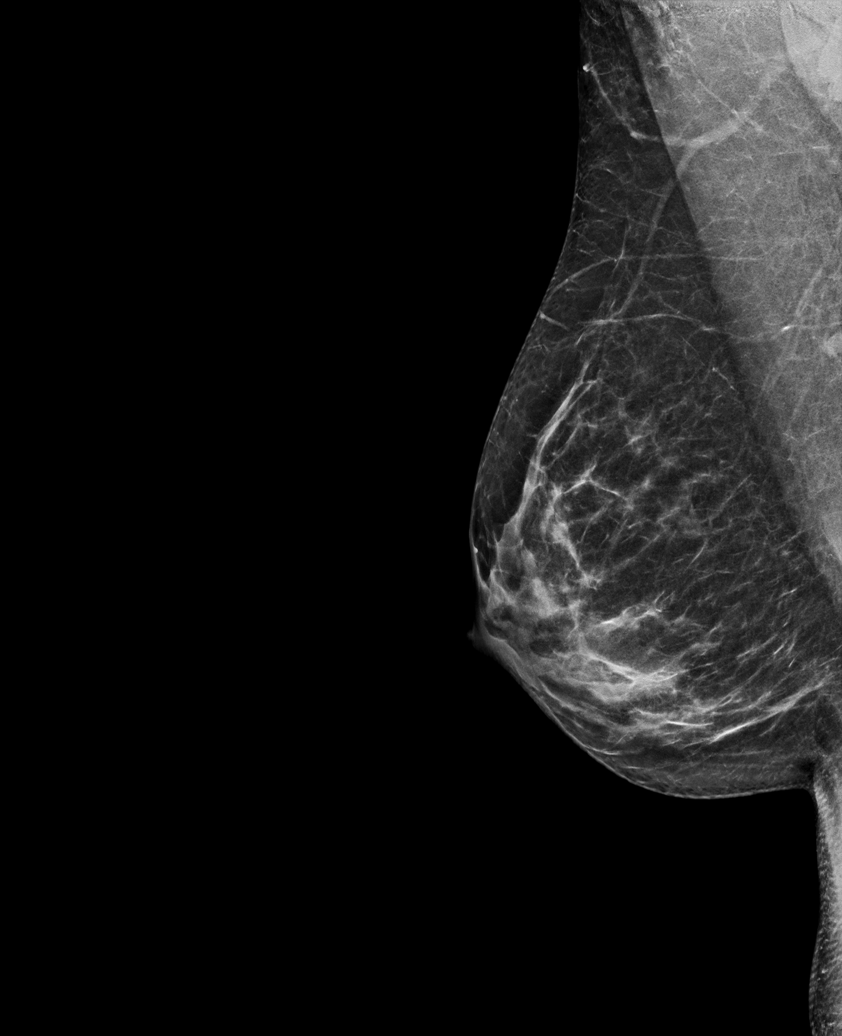

[R XCCL synth-2D]
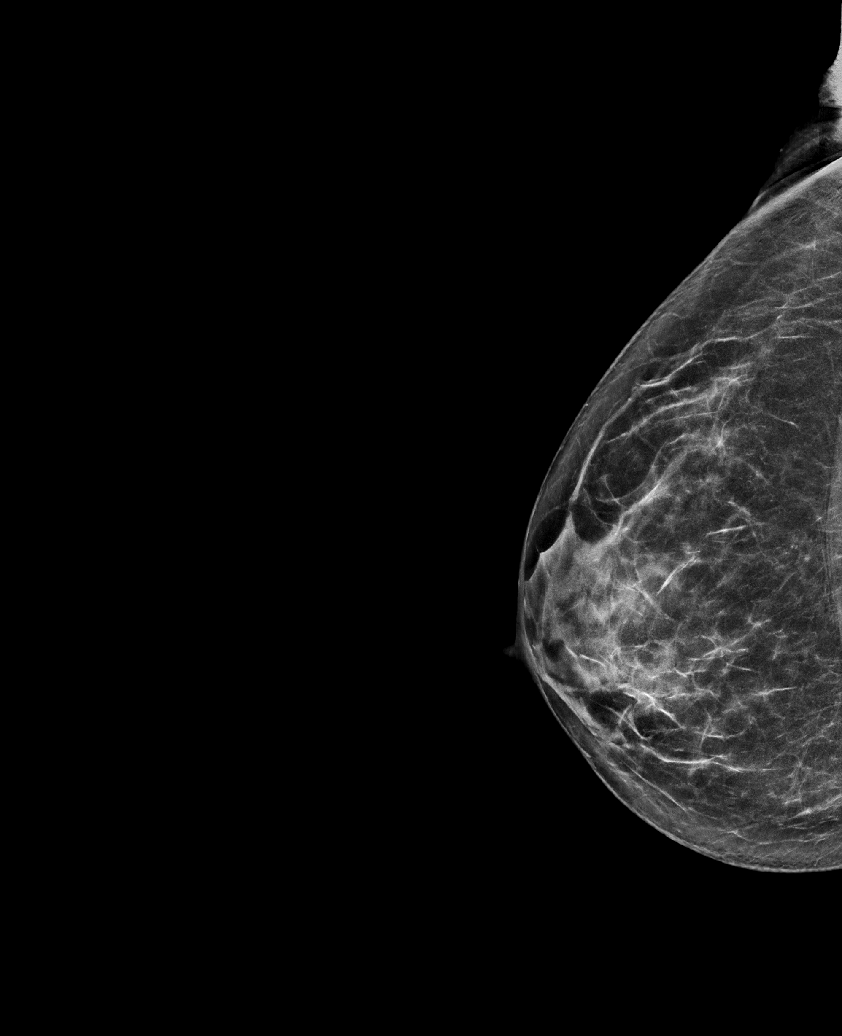

[R MLO tomo · tomo slice 31/62.0]
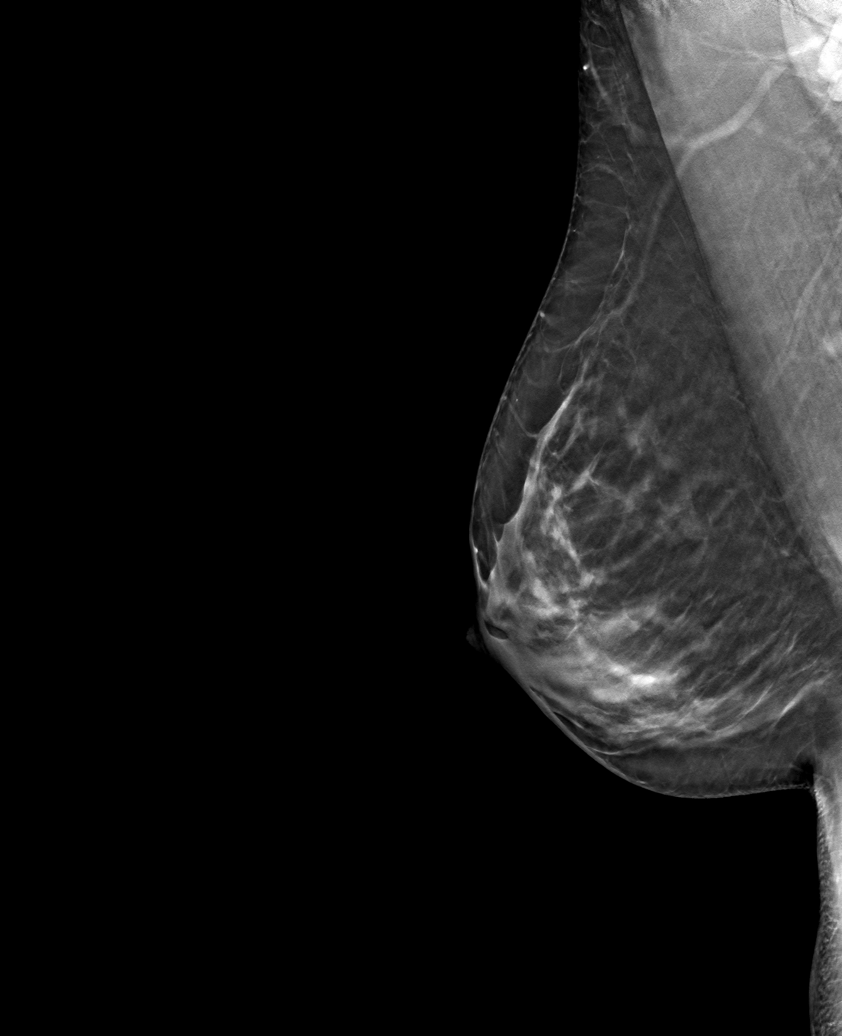

[6 of 30 positions shown; findings below may reference images not displayed]

ACR Breast Density Category c: The breast tissue is heterogeneously
dense, which may obscure small masses.
FINDINGS: There are no findings suspicious for malignancy. Images were
processed with CAD.
IMPRESSION: No mammographic evidence of malignancy. A result letter of this
screening mammogram will be mailed directly to the patient.

RECOMMENDATION:
Screening mammogram in one year. (Code:[id number]-[id number])

BI-RADS CATEGORY  1: Negative.

## 2020-06-27 ENCOUNTER — Encounter: Payer: Self-pay | Admitting: Family Medicine

## 2020-06-27 ENCOUNTER — Other Ambulatory Visit: Payer: Self-pay

## 2020-06-27 ENCOUNTER — Ambulatory Visit (INDEPENDENT_AMBULATORY_CARE_PROVIDER_SITE_OTHER): Payer: BC Managed Care – PPO | Admitting: Family Medicine

## 2020-06-27 VITALS — Temp 97.7°F

## 2020-06-27 DIAGNOSIS — J029 Acute pharyngitis, unspecified: Secondary | ICD-10-CM | POA: Insufficient documentation

## 2020-06-27 MED ORDER — PENICILLIN V POTASSIUM 500 MG PO TABS: 500.0000 mg | ORAL_TABLET | Freq: Three times a day (TID) | ORAL | 0 refills | Status: AC

## 2020-06-27 NOTE — Progress Notes (Signed)
Virtual Visit via Telephone  The purpose of this virtual visit is to provide medical care while limiting exposure to the novel coronavirus (COVID19) for both patient and office staff.  Consent was obtained for phone visit:  Yes.   Answered questions that patient had about telehealth interaction:  Yes.   I discussed the limitations, risks, security and privacy concerns of performing an evaluation and management service by telephone. I also discussed with the patient that there may be a patient responsible charge related to this service. The patient expressed understanding and agreed to proceed.  Patient is at home and is accessed via telephone Services are provided by Harlin Rain, FNP-C from [facility])  ---------------------------------------------------------------------- Chief Complaint  Patient presents with  . Sore Throat    difficulty swallowing, white patches, redness in the throat x 5 days. She state it feels like she is swallowing glass when she have to swallow. She complains of fatigue, bodyaches and  malaise.  Negative home COVID test x 1 week ago    S: Reviewed CMA documentation. I have called patient and gathered additional HPI as follows:  Ms. Fifer presents to clinic for a telemedicine visit.  Reports that she has been having 5 days of sore throat with difficulty swallowing, feels like she is swallowing glass, white patches and redness in the back of the throat.  Has associated fatigue, body aches and malaise.  Reports negative COVID test at home x 1 week ago.  Has been exposed to a student 3 weeks ago that had COVID but that student has not returned to school.  Has not taken anything that has improved symptoms.  Patient is currently home Denies any high risk travel to areas of current concern for COVID19. Denies any known or suspected exposure to person with or possibly with COVID19.  Denies any fevers, chills, sweats, body ache, cough,  shortness of breath, sinus pain or pressure, headache, abdominal pain, diarrhea  Past Medical History:  Diagnosis Date  . ADHD (attention deficit hyperactivity disorder)   . Anxiety    Social History   Tobacco Use  . Smoking status: Never Smoker  . Smokeless tobacco: Never Used  Vaping Use  . Vaping Use: Never used  Substance Use Topics  . Alcohol use: No    Alcohol/week: 0.0 standard drinks  . Drug use: No    Current Outpatient Medications:  .  methylphenidate (RITALIN) 10 MG tablet, Take 10 mg by mouth 2 (two) times daily with breakfast and lunch. , Disp: , Rfl:  .  triamcinolone (NASACORT ALLERGY 24HR) 55 MCG/ACT AERO nasal inhaler, Place 2 sprays into the nose daily., Disp: 1 Inhaler, Rfl: 12 .  acetaminophen (TYLENOL) 500 MG tablet, Take 1,000 mg by mouth every 6 (six) hours as needed. (Patient not taking: Reported on 06/27/2020), Disp: , Rfl:  .  aspirin-acetaminophen-caffeine (EXCEDRIN MIGRAINE) 250-250-65 MG tablet, Take 1 tablet by mouth every 6 (six) hours as needed for headache. (Patient not taking: Reported on 06/27/2020), Disp: , Rfl:  .  penicillin v potassium (VEETID) 500 MG tablet, Take 1 tablet (500 mg total) by mouth 3 (three) times daily for 10 days., Disp: 30 tablet, Rfl: 0 .  sertraline (ZOLOFT) 25 MG tablet, Take 25 mg by mouth daily. (Patient not taking: Reported on 06/27/2020), Disp: , Rfl:   No flowsheet data found.  No flowsheet data found.  -------------------------------------------------------------------------- O: No physical exam performed due to remote telephone encounter.  Physical Exam: Patient remotely monitored without  video.  Verbal communication appropriate.  Cognition normal.  No results found for this or any previous visit (from the past 2160 hour(s)).  -------------------------------------------------------------------------- A&P:  Problem List Items Addressed This Visit      Respiratory   Pharyngitis - Primary    Likely streptococcal  pharyngitis based on symptoms, limited availability for testing as patient is remote during telemedicine visit.  Based on history and symptoms will treat as streptococcal pharyngitis.  Discussed if does not improve on antibiotics, will have patient return to clinic and we can send throat culture to the lab for evaluation.  Plan: 1. Begin penicillin V potassium 500mg , 1 tablet 3x per day for the next 10 days 2. Be sure once has completed 24 hours of antibiotics to throw out toothbrush and begin using new one to prevent reinfection 3. To RTC if symptoms worsen or fail to improve with current treatment plan.      Relevant Medications   penicillin v potassium (VEETID) 500 MG tablet      Meds ordered this encounter  Medications  . penicillin v potassium (VEETID) 500 MG tablet    Sig: Take 1 tablet (500 mg total) by mouth 3 (three) times daily for 10 days.    Dispense:  30 tablet    Refill:  0    Follow-up: - Return if symptoms worsen or fail to improve with current treatment  Patient verbalizes understanding with the above medical recommendations including the limitation of remote medical advice.  Specific follow-up and call-back criteria were given for patient to follow-up or seek medical care more urgently if needed.  - Time spent in direct consultation with patient on phone: 6 minutes Harlin Rain, [facility] 06/27/2020, 2:52 PM

## 2020-06-27 NOTE — Patient Instructions (Signed)
To begin penicillin V potassium 500mg  tablets, 3x per day for the next 10 days.  To be sure to change out toothbrush after being on antibiotics for 24 hours to prevent re-infection.  We will plan to see you back if symptoms worsen or fail to improve with current treatment plan.  You will receive a survey after today's visit either digitally by e-mail or paper by [employer]. Your experiences and feedback matter to [place].  Please respond so we know how we are doing as we provide care for you.  Call us with any questions/concerns/needs.  It is my goal to be available to you for your health concerns.  Thanks for choosing me to be a partner in your healthcare needs!  Harlin Rain, FNP-C Family Nurse Practitioner [facility] Phone: [phone]

## 2020-06-27 NOTE — Assessment & Plan Note (Signed)
Likely streptococcal pharyngitis based on symptoms, limited availability for testing as patient is remote during telemedicine visit.  Based on history and symptoms will treat as streptococcal pharyngitis.  Discussed if does not improve on antibiotics, will have patient return to clinic and we can send throat culture to the lab for evaluation.  Plan: 1. Begin penicillin V potassium 500mg , 1 tablet 3x per day for the next 10 days 2. Be sure once has completed 24 hours of antibiotics to throw out toothbrush and begin using new one to prevent reinfection 3. To RTC if symptoms worsen or fail to improve with current treatment plan.

## 2020-07-15 ENCOUNTER — Ambulatory Visit (INDEPENDENT_AMBULATORY_CARE_PROVIDER_SITE_OTHER): Payer: BC Managed Care – PPO | Admitting: Family Medicine

## 2020-07-15 ENCOUNTER — Encounter: Payer: Self-pay | Admitting: Family Medicine

## 2020-07-15 ENCOUNTER — Other Ambulatory Visit: Payer: Self-pay

## 2020-07-15 ENCOUNTER — Telehealth: Payer: Self-pay

## 2020-07-15 VITALS — BP 118/85 | HR 67 | Temp 98.6°F | Resp 17 | Ht 65.0 in | Wt 148.0 lb

## 2020-07-15 DIAGNOSIS — J029 Acute pharyngitis, unspecified: Secondary | ICD-10-CM | POA: Diagnosis not present

## 2020-07-15 MED ORDER — AMOXICILLIN-POT CLAVULANATE 875-125 MG PO TABS: 1.0000 | ORAL_TABLET | Freq: Two times a day (BID) | ORAL | 0 refills | Status: DC

## 2020-07-15 NOTE — Patient Instructions (Signed)
I have sent in a prescription for Augmentin to take 1 tablet 2x per day for the next 10 days.  We are sending your throat swab to the lab for culture and will contact you when we receive the results.  Be sure to throw out your toothbrush and begin using a new one once you have been on antibiotics for 24 hours to prevent reinfection.  We will plan to see you back if your symptoms worsen or fail to improve  You will receive a survey after today's visit either digitally by e-mail or paper by [employer] mail. Your experiences and feedback matter to [place].  Please respond so we know how we are doing as we provide care for you.  Call us with any questions/concerns/needs.  It is my goal to be available to you for your health concerns.  Thanks for choosing me to be a partner in your healthcare needs!  Harlin Rain, FNP-C Family Nurse Practitioner [facility] Phone: [phone]

## 2020-07-15 NOTE — Telephone Encounter (Signed)
Would need an appointment with [place] or urgent care for evaluation.  She was prescribed antibiotics on 8/5 for 10 days, that should have eradicated any streptococcal sore throat.  Since the symptoms went away and then came back, it may be something else and would be safer for her to be seen and evaluated.

## 2020-07-15 NOTE — Progress Notes (Signed)
Subjective:    Patient ID: Regina [name], female    DOB: 08/25/1981, 39 y.o.   MRN: [id number]  Regina [name] is a 39 y.o. female presenting on 07/15/2020 for Sore Throat (pt was treated for strep throat x 2.5 weeks ago. Symptoms resolved, but on last night she developed some sore throat and fatigue. )   HPI  Regina [name] presents to clinic for concerns of sore throat x 2.5 weeks.  Reports that symptoms had resolved with a course of penicillin x 10 days, but that she has started to have a sore throat and white patches again over the past few days.  Denies any fevers, changes in taste/smell, difficulty swallowing, difficulty breathing, exposure to anyone else with similar symptoms, abdominal pain, n/v/d.  No flowsheet data found.  Social History   Tobacco Use  . Smoking status: Never Smoker  . Smokeless tobacco: Never Used  Vaping Use  . Vaping Use: Never used  Substance Use Topics  . Alcohol use: Yes    Alcohol/week: 0.0 standard drinks    Comment: occasionally  . Drug use: No    Review of Systems  Constitutional: Negative.   HENT: Positive for sore throat. Negative for congestion, dental problem, drooling, ear discharge, ear pain, facial swelling, hearing loss, mouth sores, nosebleeds, postnasal drip, rhinorrhea, sinus pressure, sinus pain, sneezing, tinnitus, trouble swallowing and voice change.   Eyes: Negative.   Respiratory: Negative.   Cardiovascular: Negative.   Gastrointestinal: Negative.   Endocrine: Negative.   Genitourinary: Negative.   Musculoskeletal: Negative.   Skin: Negative.   Allergic/Immunologic: Negative.   Neurological: Negative.   Hematological: Negative.   Psychiatric/Behavioral: Negative.    Per HPI unless specifically indicated above     Objective:    BP 118/85 (BP Location: Right Arm, Patient Position: Sitting, Cuff Size: Normal)   Pulse 67   Temp 98.6 F (37 C) (Oral)   Resp 17   Ht 5\' 5"  (1.651 m)   Wt 148 lb (67.1 kg)   SpO2 100%   BMI  24.63 kg/m   Wt Readings from Last 3 Encounters:  07/15/20 148 lb (67.1 kg)  05/11/19 142 lb 9.6 oz (64.7 kg)  02/04/18 136 lb 6.4 oz (61.9 kg)    Physical Exam Vitals reviewed.  Constitutional:      General: She is not in acute distress.    Appearance: Normal appearance. She is well-developed, well-groomed and normal weight. She is not ill-appearing or toxic-appearing.  HENT:     Head: Normocephalic and atraumatic.     Nose: No congestion or rhinorrhea.     Comments: [name] is in place, covering mouth and nose.    Mouth/Throat:     Mouth: Mucous membranes are moist.     Pharynx: Uvula midline. No pharyngeal swelling, oropharyngeal exudate or posterior oropharyngeal erythema.     Tonsils: Tonsillar exudate present. No tonsillar abscesses.  Eyes:     General: Lids are normal. Vision grossly intact.        Right eye: No discharge.        Left eye: No discharge.     Extraocular Movements: Extraocular movements intact.     Conjunctiva/sclera: Conjunctivae normal.     Pupils: Pupils are equal, round, and reactive to light.  Cardiovascular:     Rate and Rhythm: Normal rate and regular rhythm.     Pulses: Normal pulses.     Heart sounds: Normal heart sounds. No murmur heard.  No friction rub. No gallop.  Pulmonary:     Effort: Pulmonary effort is normal. No respiratory distress.     Breath sounds: Normal breath sounds.  Musculoskeletal:     Right lower leg: No edema.     Left lower leg: No edema.  Skin:    General: Skin is warm and dry.     Capillary Refill: Capillary refill takes less than 2 seconds.  Neurological:     General: No focal deficit present.     Mental Status: She is alert and oriented to person, place, and time.  Psychiatric:        Attention and Perception: Attention and perception normal.        Mood and Affect: Mood and affect normal.        Speech: Speech normal.        Behavior: Behavior normal. Behavior is cooperative.        Thought Content: Thought  content normal.        Cognition and Memory: Cognition and memory normal.        Judgment: Judgment normal.    Results for orders placed or performed in visit on 02/04/18  POCT rapid strep A  Result Value Ref Range   Rapid Strep A Screen Negative Negative  POCT Influenza A/B  Result Value Ref Range   Influenza A, POC Negative Negative   Influenza B, POC Negative Negative      Assessment & Plan:   Problem List Items Addressed This Visit      Respiratory   Pharyngitis - Primary    Had telemedicine visit 06/27/2020 for probable streptococcal pharyngitis and was treated empirically with Penicillin V Potassium.  Reports completed full course of antibiotics and a few days after completing her symptoms have started to return.  Has continued white exudate on tonsils.  POCT strep negative, will send for throat culture.  Will start on Augmentin 875-125mg  BID x 10 days while awaiting culture.  Plan: 1. Throat culture sent to lab for processing 2. Begin Augmentin 875-125mg  BID x 10 days 3. RTC if symptoms worsen or fail to improve      Relevant Medications   amoxicillin-clavulanate (AUGMENTIN) 875-125 MG tablet   Other Relevant Orders   POCT rapid strep A   Culture, Group A Strep      Meds ordered this encounter  Medications  . amoxicillin-clavulanate (AUGMENTIN) 875-125 MG tablet    Sig: Take 1 tablet by mouth 2 (two) times daily.    Dispense:  20 tablet    Refill:  0      Follow up plan: Return if symptoms worsen or fail to improve.   Harlin Rain, [facility] 07/15/2020, 4:59 PM

## 2020-07-15 NOTE — Telephone Encounter (Signed)
Copied from [id number]. Topic: General - Inquiry >> Jul 15, 2020 11:31 AM Alease Frame wrote: Reason for CRM: Pt is calling to inform Malfi that her strep throat symptoms has statred again and it is now on the left side . She cant miss work and she wanting more antibiotics prescribed to help with the other side. Please reach out to pt to discuss next steps for medication .

## 2020-07-16 LAB — POCT RAPID STREP A (OFFICE): Rapid Strep A Screen: NEGATIVE

## 2020-07-16 NOTE — Assessment & Plan Note (Signed)
Had telemedicine visit 06/27/2020 for probable streptococcal pharyngitis and was treated empirically with Penicillin V Potassium.  Reports completed full course of antibiotics and a few days after completing her symptoms have started to return.  Has continued white exudate on tonsils.  POCT strep negative, will send for throat culture.  Will start on Augmentin 875-125mg  BID x 10 days while awaiting culture.  Plan: 1. Throat culture sent to lab for processing 2. Begin Augmentin 875-125mg  BID x 10 days 3. RTC if symptoms worsen or fail to improve

## 2020-07-17 LAB — CULTURE, GROUP A STREP
MICRO NUMBER:: 10859283
SPECIMEN QUALITY:: ADEQUATE

## 2020-07-25 ENCOUNTER — Other Ambulatory Visit: Payer: Self-pay | Admitting: Family Medicine

## 2020-09-30 ENCOUNTER — Other Ambulatory Visit: Payer: Self-pay

## 2020-09-30 ENCOUNTER — Ambulatory Visit (INDEPENDENT_AMBULATORY_CARE_PROVIDER_SITE_OTHER): Payer: BC Managed Care – PPO

## 2020-09-30 DIAGNOSIS — Z872 Personal history of diseases of the skin and subcutaneous tissue: Secondary | ICD-10-CM

## 2020-09-30 DIAGNOSIS — L578 Other skin changes due to chronic exposure to nonionizing radiation: Secondary | ICD-10-CM

## 2020-09-30 DIAGNOSIS — I781 Nevus, non-neoplastic: Secondary | ICD-10-CM

## 2020-11-11 ENCOUNTER — Ambulatory Visit: Payer: BC Managed Care – PPO | Admitting: Dermatology

## 2020-11-11 ENCOUNTER — Other Ambulatory Visit: Payer: Self-pay

## 2020-11-11 DIAGNOSIS — L578 Other skin changes due to chronic exposure to nonionizing radiation: Secondary | ICD-10-CM

## 2020-11-11 DIAGNOSIS — D2272 Melanocytic nevi of left lower limb, including hip: Secondary | ICD-10-CM | POA: Diagnosis not present

## 2020-11-11 DIAGNOSIS — D492 Neoplasm of unspecified behavior of bone, soft tissue, and skin: Secondary | ICD-10-CM

## 2020-11-11 DIAGNOSIS — D229 Melanocytic nevi, unspecified: Secondary | ICD-10-CM

## 2020-11-11 DIAGNOSIS — L820 Inflamed seborrheic keratosis: Secondary | ICD-10-CM | POA: Diagnosis not present

## 2020-11-11 DIAGNOSIS — D1800 Hemangioma unspecified site: Secondary | ICD-10-CM

## 2020-11-11 DIAGNOSIS — L814 Other melanin hyperpigmentation: Secondary | ICD-10-CM | POA: Diagnosis not present

## 2020-11-11 DIAGNOSIS — L821 Other seborrheic keratosis: Secondary | ICD-10-CM

## 2020-11-11 DIAGNOSIS — Z1283 Encounter for screening for malignant neoplasm of skin: Secondary | ICD-10-CM

## 2020-11-11 DIAGNOSIS — Z86018 Personal history of other benign neoplasm: Secondary | ICD-10-CM

## 2020-11-11 NOTE — Progress Notes (Signed)
Follow-Up Visit   Subjective  Regina [name] is a 39 y.o. female who presents for the following: TBSE (Patient here today for TBSE, states she has area on back that has been bothering her. She noticed about 3 - 4 months ago. ). The patient presents for Total-Body Skin Exam (TBSE) for skin cancer screening and mole check.  The following portions of the chart were reviewed this encounter and updated as appropriate:  Tobacco  Allergies  Meds  Problems  Med Hx  Surg Hx  Fam Hx      Objective  Well appearing patient in no apparent distress; mood and affect are within normal limits.  A full examination was performed including scalp, head, eyes, ears, nose, lips, neck, chest, axillae, abdomen, back, buttocks, bilateral upper extremities, bilateral lower extremities, hands, feet, fingers, toes, fingernails, and toenails. All findings within normal limits unless otherwise noted below.  Objective  left back: Erythematous keratotic or waxy stuck-on papule or plaque.   Objective  Left pretibial: 0.3cm irregular brown macule   Assessment & Plan  Inflamed seborrheic keratosis left back  Destruction of lesion - left back Complexity: simple   Destruction method: cryotherapy   Informed consent: discussed and consent obtained   Timeout:  patient name, date of birth, surgical site, and procedure verified Lesion destroyed using liquid nitrogen: Yes   Region frozen until ice ball extended beyond lesion: Yes   Outcome: patient tolerated procedure well with no complications   Post-procedure details: wound care instructions given    Neoplasm of skin Left pretibial  Epidermal / dermal shaving  Lesion diameter (cm):  0.3 Informed consent: discussed and consent obtained   Timeout: patient name, date of birth, surgical site, and procedure verified   Procedure prep:  Patient was prepped and draped in usual sterile fashion Prep type:  Isopropyl alcohol Anesthesia: the lesion was anesthetized  in a standard fashion   Anesthetic:  1% lidocaine w/ epinephrine 1-100,000 buffered w/ 8.4% NaHCO3 Instrument used: flexible razor blade   Hemostasis achieved with: pressure, aluminum chloride and electrodesiccation   Outcome: patient tolerated procedure well   Post-procedure details: sterile dressing applied and wound care instructions given   Dressing type: bandage and petrolatum    Specimen 1 - Surgical pathology Differential Diagnosis: Nevus vs Dysplastic Nevus  Check Margins: No 0.3cm irregular brown macule   Skin cancer screening   Lentigines - Scattered tan macules - Discussed due to sun exposure - Benign, observe - Call for any changes  Seborrheic Keratoses - Stuck-on, waxy, tan-brown papules and plaques  - Discussed benign etiology and prognosis. - Observe - Call for any changes  Melanocytic Nevi - Tan-brown and/or pink-flesh-colored symmetric macules and papules - Benign appearing on exam today - Observation - Call clinic for new or changing moles - Recommend daily use of broad spectrum spf 30+ sunscreen to sun-exposed areas.   Hemangiomas - Red papules - Discussed benign nature - Observe - Call for any changes  Actinic Damage - Chronic, secondary to cumulative UV/sun exposure - diffuse scaly erythematous macules with underlying dyspigmentation - Recommend daily broad spectrum sunscreen SPF 30+ to sun-exposed areas, reapply every 2 hours as needed.  - Call for new or changing lesions.  Skin cancer screening performed today.   History of Dysplastic Nevi - No evidence of recurrence today - Recommend regular full body skin exams - Recommend daily broad spectrum sunscreen SPF 30+ to sun-exposed areas, reapply every 2 hours as needed.  - Call if any new  or changing lesions are noted between office visits  Return in about 1 year (around 11/11/2021) for tbse.   IRuthell Rummage, CMA, am acting as scribe for Sarina Ser, MD.  Documentation: I have  reviewed the above documentation for accuracy and completeness, and I agree with the above.  Sarina Ser, MD

## 2020-11-11 NOTE — Patient Instructions (Addendum)
Melanoma ABCDEs  Melanoma is the most dangerous type of skin cancer, and is the leading cause of death from skin disease.  You are more likely to develop melanoma if you:  Have light-colored skin, light-colored eyes, or red or blond hair  Spend a lot of time in the sun  Tan regularly, either outdoors or in a tanning bed  Have had blistering sunburns, especially during childhood  Have a close family member who has had a melanoma  Have atypical moles or large birthmarks  Early detection of melanoma is key since treatment is typically straightforward and cure rates are extremely high if we catch it early.   The first sign of melanoma is often a change in a mole or a new dark spot.  The ABCDE system is a way of remembering the signs of melanoma.  A for asymmetry:  The two halves do not match. B for border:  The edges of the growth are irregular. C for color:  A mixture of colors are present instead of an even brown color. D for diameter:  Melanomas are usually (but not always) greater than 42mm - the size of a pencil eraser. E for evolution:  The spot keeps changing in size, shape, and color.  Please check your skin once per month between visits. You can use a small mirror in front and a large mirror behind you to keep an eye on the back side or your body.   If you see any new or changing lesions before your next follow-up, please call to schedule a visit.  Please continue daily skin protection including broad spectrum sunscreen SPF 30+ to sun-exposed areas, reapplying every 2 hours as needed when you're outdoors.   Cryotherapy Aftercare  . Wash gently with soap and water everyday.   Marland Kitchen Apply Vaseline and Band-Aid daily until healed.  Wound Care Instructions  1. Cleanse wound gently with soap and water once a day then pat dry with clean gauze. Apply a thing coat of Petrolatum (petroleum jelly, "Vaseline") over the wound (unless you have an allergy to this). We recommend that you use  a new, sterile tube of Vaseline. Do not pick or remove scabs. Do not remove the yellow or white "healing tissue" from the base of the wound.  2. Cover the wound with fresh, clean, nonstick gauze and secure with paper tape. You may use Band-Aids in place of gauze and tape if the would is small enough, but would recommend trimming much of the tape off as there is often too much. Sometimes Band-Aids can irritate the skin.  3. You should call the office for your biopsy report after 1 week if you have not already been contacted.  4. If you experience any problems, such as abnormal amounts of bleeding, swelling, significant bruising, significant pain, or evidence of infection, please call the office immediately.  5. FOR ADULT SURGERY PATIENTS: If you need something for pain relief you may take 1 extra strength Tylenol (acetaminophen) AND 2 Ibuprofen (200mg  each) together every 4 hours as needed for pain. (do not take these if you are allergic to them or if you have a reason you should not take them.) Typically, you may only need pain medication for 1 to 3 days.

## 2020-11-18 ENCOUNTER — Encounter: Payer: Self-pay | Admitting: Dermatology

## 2020-11-19 ENCOUNTER — Telehealth: Payer: Self-pay

## 2020-11-19 NOTE — Telephone Encounter (Signed)
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

## 2020-11-19 NOTE — Telephone Encounter (Signed)
Patient informed of pathology results and appointment scheduled.  °

## 2021-01-14 ENCOUNTER — Ambulatory Visit: Payer: BC Managed Care – PPO | Admitting: Dermatology

## 2021-01-14 ENCOUNTER — Other Ambulatory Visit: Payer: Self-pay

## 2021-01-14 DIAGNOSIS — L578 Other skin changes due to chronic exposure to nonionizing radiation: Secondary | ICD-10-CM

## 2021-01-14 DIAGNOSIS — D2372 Other benign neoplasm of skin of left lower limb, including hip: Secondary | ICD-10-CM | POA: Diagnosis not present

## 2021-01-14 DIAGNOSIS — D239 Other benign neoplasm of skin, unspecified: Secondary | ICD-10-CM

## 2021-01-14 NOTE — Progress Notes (Signed)
   Follow-Up Visit   Subjective  Regina [name] is a 40 y.o. female who presents for the following: Follow-up (Biopsy proven moderate-severe dysplastic nevus of left pretibial - Shave removal today).  The following portions of the chart were reviewed this encounter and updated as appropriate:   Tobacco  Allergies  Meds  Problems  Med Hx  Surg Hx  Fam Hx     Review of Systems:  No other skin or systemic complaints except as noted in HPI or Assessment and Plan.  Objective  Well appearing patient in no apparent distress; mood and affect are within normal limits.  A focused examination was performed including left lower leg. Relevant physical exam findings are noted in the Assessment and Plan.  Objective  Left pretibial: Well healed biopsy site   Assessment & Plan    Actinic Damage - chronic, secondary to cumulative UV radiation exposure/sun exposure over time - diffuse scaly erythematous macules with underlying dyspigmentation - Recommend daily broad spectrum sunscreen SPF 30+ to sun-exposed areas, reapply every 2 hours as needed.  - Call for new or changing lesions.  Dysplastic nevus Left pretibial  Epidermal / dermal shaving - Left pretibial  Lesion diameter (cm):  0.8 Informed consent: discussed and consent obtained   Timeout: patient name, date of birth, surgical site, and procedure verified   Procedure prep:  Patient was prepped and draped in usual sterile fashion Prep type:  Isopropyl alcohol Anesthesia: the lesion was anesthetized in a standard fashion   Anesthetic:  1% lidocaine w/ epinephrine 1-100,000 buffered w/ 8.4% NaHCO3 Instrument used: flexible razor blade   Hemostasis achieved with: pressure, aluminum chloride and electrodesiccation   Outcome: patient tolerated procedure well   Post-procedure details: sterile dressing applied and wound care instructions given   Dressing type: bandage and petrolatum    Specimen 1 - Surgical pathology Differential  Diagnosis: Biopsy proven moderate-severe dysplastic nevus Check Margins: Yes Well healed biopsy site [phone]  Return for Follow up as scheduled, TBSE.   I, Ashok Cordia, CMA, am acting as scribe for Sarina Ser, MD .  Documentation: I have reviewed the above documentation for accuracy and completeness, and I agree with the above.  Sarina Ser, MD

## 2021-01-14 NOTE — Patient Instructions (Signed)

## 2021-01-15 ENCOUNTER — Encounter: Payer: Self-pay | Admitting: Dermatology

## 2021-01-21 ENCOUNTER — Telehealth: Payer: Self-pay

## 2021-01-21 NOTE — Telephone Encounter (Signed)
Advised patient of results/hd  

## 2021-01-21 NOTE — Telephone Encounter (Signed)
-----   Message from Ralene Bathe, MD sent at 01/18/2021 11:14 AM EST ----- Diagnosis Skin , left pretibial NO RESIDUAL DYSPLASTIC NEVUS, MARGINS FREE  Site of severe dysplastic Margins clear Recheck next visit

## 2021-07-11 ENCOUNTER — Other Ambulatory Visit: Payer: Self-pay | Admitting: Obstetrics and Gynecology

## 2021-07-11 DIAGNOSIS — Z1231 Encounter for screening mammogram for malignant neoplasm of breast: Secondary | ICD-10-CM

## 2021-11-12 ENCOUNTER — Ambulatory Visit: Payer: BC Managed Care – PPO | Admitting: Dermatology

## 2021-12-17 ENCOUNTER — Ambulatory Visit: Payer: BC Managed Care – PPO | Admitting: Dermatology

## 2021-12-25 ENCOUNTER — Ambulatory Visit (INDEPENDENT_AMBULATORY_CARE_PROVIDER_SITE_OTHER): Payer: BC Managed Care – PPO | Admitting: Dermatology

## 2021-12-25 ENCOUNTER — Other Ambulatory Visit: Payer: Self-pay

## 2021-12-25 DIAGNOSIS — Z1283 Encounter for screening for malignant neoplasm of skin: Secondary | ICD-10-CM

## 2021-12-25 DIAGNOSIS — L814 Other melanin hyperpigmentation: Secondary | ICD-10-CM

## 2021-12-25 DIAGNOSIS — L719 Rosacea, unspecified: Secondary | ICD-10-CM | POA: Diagnosis not present

## 2021-12-25 DIAGNOSIS — L578 Other skin changes due to chronic exposure to nonionizing radiation: Secondary | ICD-10-CM

## 2021-12-25 DIAGNOSIS — D229 Melanocytic nevi, unspecified: Secondary | ICD-10-CM | POA: Diagnosis not present

## 2021-12-25 DIAGNOSIS — L821 Other seborrheic keratosis: Secondary | ICD-10-CM

## 2021-12-25 DIAGNOSIS — D1800 Hemangioma unspecified site: Secondary | ICD-10-CM

## 2021-12-25 DIAGNOSIS — Z86018 Personal history of other benign neoplasm: Secondary | ICD-10-CM

## 2021-12-25 NOTE — Patient Instructions (Addendum)
Seborrheic Keratosis  What causes seborrheic keratoses? Seborrheic keratoses are harmless, common skin growths that first appear during adult life.  As time goes by, more growths appear.  Some people may develop a large number of them.  Seborrheic keratoses appear on both covered and uncovered body parts.  They are not caused by sunlight.  The tendency to develop seborrheic keratoses can be inherited.  They vary in color from skin-colored to gray, brown, or even black.  They can be either smooth or have a rough, warty surface.   Seborrheic keratoses are superficial and look as if they were stuck on the skin.  Under the microscope this type of keratosis looks like layers upon layers of skin.  That is why at times the top layer may seem to fall off, but the rest of the growth remains and re-grows.    Treatment Seborrheic keratoses do not need to be treated, but can easily be removed in the office.  Seborrheic keratoses often cause symptoms when they rub on clothing or jewelry.  Lesions can be in the way of shaving.  If they become inflamed, they can cause itching, soreness, or burning.  Removal of a seborrheic keratosis can be accomplished by freezing, burning, or surgery. If any spot bleeds, scabs, or grows rapidly, please return to have it checked, as these can be an indication of a skin cancer.    If You Need Anything After Your Visit  If you have any questions or concerns for your doctor, please call our main line at [phone] and press option 4 to reach your doctor's medical assistant. If no one answers, please leave a voicemail as directed and we will return your call as soon as possible. Messages left after 4 pm will be answered the following business day.   You may also send [place] a message via [place]. We typically respond to MyChart messages within 1-2 business days.  For prescription refills, please ask your pharmacy to contact our office. Our fax number is [phone].  If you have an  urgent issue when the clinic is closed that cannot wait until the next business day, you can page your doctor at the number below.    Please note that while we do our best to be available for urgent issues outside of office hours, we are not available 24/7.   If you have an urgent issue and are unable to reach [place], you may choose to seek medical care at your doctor's office, retail clinic, urgent care center, or emergency room.  If you have a medical emergency, please immediately call 911 or go to the emergency department.  Pager Numbers  - Dr. Nehemiah Massed: [phone]  - Dr. Laurence Ferrari: [phone]  - Dr. Nicole Kindred: [phone]  In the event of inclement weather, please call our main line at [phone] for an update on the status of any delays or closures.  Dermatology Medication Tips: Please keep the boxes that topical medications come in in order to help keep track of the instructions about where and how to use these. Pharmacies typically print the medication instructions only on the boxes and not directly on the medication tubes.   If your medication is too expensive, please contact our office at [phone] option 4 or send [place] a message through [place].   We are unable to tell what your co-pay for medications will be in advance as this is different depending on your insurance coverage. However, we may be able to find a substitute medication at lower  cost or fill out paperwork to get insurance to cover a needed medication.   If a prior authorization is required to get your medication covered by your insurance company, please allow [place] 1-2 business days to complete this process.  Drug prices often vary depending on where the prescription is filled and some pharmacies may offer cheaper prices.  The website www.goodrx.com contains coupons for medications through different pharmacies. The prices here do not account for what the cost may be with help from insurance (it may be cheaper with your  insurance), but the website can give you the price if you did not use any insurance.  - You can print the associated coupon and take it with your prescription to the pharmacy.  - You may also stop by our office during regular business hours and pick up a GoodRx coupon card.  - If you need your prescription sent electronically to a different pharmacy, notify our office through [facility] or by phone at [phone] option 4.     Si Usted Necesita Algo Despus de Su Visita  Tambin puede enviarnos un mensaje a travs de Pharmacist, community. Por lo general respondemos a los mensajes de MyChart en el transcurso de 1 a 2 das hbiles.  Para renovar recetas, por favor pida a su farmacia que se ponga en contacto con nuestra oficina. Harland Dingwall de fax es [place] [phone].  Si tiene un asunto urgente cuando la clnica est cerrada y que no puede esperar hasta el siguiente da hbil, puede llamar/localizar a su doctor(a) al nmero que aparece a continuacin.   Por favor, tenga en cuenta que aunque hacemos todo lo posible para estar disponibles para asuntos urgentes fuera del horario de [place], no estamos disponibles las 24 horas del da, los 7 das de la [place].   Si tiene un problema urgente y no puede comunicarse con nosotros, puede optar por buscar atencin mdica  en el consultorio de su doctor(a), en una clnica privada, en un centro de atencin urgente o en una sala de emergencias.  Si tiene Engineering geologist, por favor llame inmediatamente al 911 o vaya a la sala de emergencias.  Nmeros de bper  - Dr. Nehemiah Massed: [phone]  - Dra. Moye: [phone]  - Dra. Nicole Kindred: [phone]  En caso de inclemencias del [place], por favor llame a Johnsie Kindred principal al [phone] para una actualizacin sobre el [place] de cualquier retraso o cierre.  Consejos para la medicacin en dermatologa: Por favor, guarde las cajas en las que vienen los medicamentos de uso tpico para ayudarle a  seguir las instrucciones sobre dnde y cmo usarlos. Las farmacias generalmente imprimen las instrucciones del medicamento slo en las cajas y no directamente en los tubos del [place].   Si su medicamento es muy caro, por favor, pngase en contacto con Zigmund Daniel llamando al [phone] y presione la opcin 4 o envenos un mensaje a travs de Pharmacist, community.   No podemos decirle cul ser su copago por los medicamentos por adelantado ya que esto es diferente dependiendo de la cobertura de su seguro. Sin embargo, es posible que podamos encontrar un medicamento sustituto a Electrical engineer un formulario para que el seguro cubra el medicamento que se considera necesario.   Si se requiere una autorizacin previa para que su compaa de seguros [place] su medicamento, por favor permtanos de 1 a 2 das hbiles para completar este proceso.  Los precios de los medicamentos varan con frecuencia dependiendo del Environmental consultant de dnde se surte  la receta y alguna farmacias pueden ofrecer precios ms baratos.  El sitio web www.goodrx.com tiene cupones para medicamentos de Airline pilot. Los precios aqu no tienen en cuenta lo que podra costar con la ayuda del seguro (puede ser ms barato con su seguro), pero el sitio web puede darle el precio si no utiliz Research scientist (physical sciences).  - Puede imprimir el cupn correspondiente y llevarlo con su receta a la farmacia.  - Tambin puede pasar por nuestra oficina durante el horario de atencin regular y Charity fundraiser una tarjeta de cupones de GoodRx.  - Si necesita que su receta se enve electrnicamente a una farmacia diferente, informe a nuestra oficina a travs de MyChart de [place] o por telfono llamando al [phone] y presione la opcin 4.

## 2021-12-25 NOTE — Progress Notes (Signed)
° °  Follow-Up Visit   Subjective  Regina [name] is a 41 y.o. female who presents for the following: Follow-up (Total body exam today. Hx of dysplastic nevi. No hx of skin cancer. ). The patient presents for Total-Body Skin Exam (TBSE) for skin cancer screening and mole check.  The patient has spots, moles and lesions to be evaluated, some may be new or changing and the patient has concerns that these could be cancer.  The following portions of the chart were reviewed this encounter and updated as appropriate:  Tobacco   Allergies   Meds   Problems   Med Hx   Surg Hx   Fam Hx      Review of Systems: No other skin or systemic complaints except as noted in HPI or Assessment and Plan.  Objective  Well appearing patient in no apparent distress; mood and affect are within normal limits.  A full examination was performed including scalp, head, eyes, ears, nose, lips, neck, chest, axillae, abdomen, back, buttocks, bilateral upper extremities, bilateral lower extremities, hands, feet, fingers, toes, fingernails, and toenails. All findings within normal limits unless otherwise noted below.  Head - Anterior (Face) Mid face erythema with telangiectasias +/- scattered inflammatory papules.    Assessment & Plan  Rosacea Head - Anterior (Face) Advised mild and does not need treatment.  Rosacea is a chronic progressive skin condition usually affecting the face of adults, causing redness and/or acne bumps. It is treatable but not curable. It sometimes affects the eyes (ocular rosacea) as well. It may respond to topical and/or systemic medication and can flare with stress, sun exposure, alcohol, exercise and some foods.  Daily application of broad spectrum spf 30+ sunscreen to face is recommended to reduce flares.  Skin cancer screening  Lentigines - Scattered tan macules - Due to sun exposure - Benign-appearing, observe - Recommend daily broad spectrum sunscreen SPF 30+ to sun-exposed areas, reapply  every 2 hours as needed. - Call for any changes  Seborrheic Keratoses - Stuck-on, waxy, tan-brown papules and/or plaques  - Benign-appearing - Discussed benign etiology and prognosis. - Observe - Call for any changes  Melanocytic Nevi - Tan-brown and/or pink-flesh-colored symmetric macules and papules - Benign appearing on exam today - Observation - Call clinic for new or changing moles - Recommend daily use of broad spectrum spf 30+ sunscreen to sun-exposed areas.   Hemangiomas - Red papules - Discussed benign nature - Observe - Call for any changes  Actinic Damage - Chronic condition, secondary to cumulative UV/sun exposure - diffuse scaly erythematous macules with underlying dyspigmentation - Recommend daily broad spectrum sunscreen SPF 30+ to sun-exposed areas, reapply every 2 hours as needed.  - Staying in the shade or wearing long sleeves, sun glasses (UVA+UVB protection) and wide brim hats (4-inch brim around the entire circumference of the hat) are also recommended for sun protection.  - Call for new or changing lesions.  Skin cancer screening performed today.  History of Dysplastic Nevi - No evidence of recurrence today - Recommend regular full body skin exams - Recommend daily broad spectrum sunscreen SPF 30+ to sun-exposed areas, reapply every 2 hours as needed.  - Call if any new or changing lesions are noted between office visits  Return in about 1 year (around 12/25/2022) for TBSE.  IHarriett Sine, CMA, am acting as scribe for Sarina Ser, MD. Documentation: I have reviewed the above documentation for accuracy and completeness, and I agree with the above.  Sarina Ser, MD

## 2021-12-27 ENCOUNTER — Encounter: Payer: Self-pay | Admitting: Dermatology

## 2022-06-02 ENCOUNTER — Encounter: Payer: Self-pay | Admitting: Physician Assistant

## 2022-06-02 ENCOUNTER — Ambulatory Visit: Payer: BC Managed Care – PPO

## 2022-06-02 ENCOUNTER — Ambulatory Visit (INDEPENDENT_AMBULATORY_CARE_PROVIDER_SITE_OTHER): Payer: BC Managed Care – PPO | Admitting: Physician Assistant

## 2022-06-02 VITALS — BP 116/72 | HR 83 | Temp 97.3°F | Ht 65.0 in | Wt 140.0 lb

## 2022-06-02 DIAGNOSIS — Z0000 Encounter for general adult medical examination without abnormal findings: Secondary | ICD-10-CM | POA: Diagnosis not present

## 2022-06-02 DIAGNOSIS — Z1231 Encounter for screening mammogram for malignant neoplasm of breast: Secondary | ICD-10-CM

## 2022-06-02 NOTE — Progress Notes (Signed)
Annual Physical Exam  Name: Regina [name]   MRN: [id number]    DOB: 05-Nov-1981   Date:06/02/2022 Today's Provider: Talitha Givens, MHS,PA-C Introduced myself to the patient as a PA-C and provided education on APPs in clinical practice.          Subjective  Chief Complaint  Chief Complaint  Patient presents with   Annual Exam    HPI  Patient presents for annual CPE.  Diet: "good"  Exercise: Every day. She goes to the gym daily - usually treadmill, weights for about 90 minutes   Sleep:Good. Getting about 9-10 hours per night. Feels well rested in the AM  Mood: no concerns.  [place] Visit from 06/02/2022 in [place]  AUDIT-C Score 0      Depression: Phq 9 is  negative    06/02/2022    1:42 PM  Depression screen PHQ 2/9  Decreased Interest 0  Down, Depressed, Hopeless 0  PHQ - 2 Score 0  Altered sleeping 0  Tired, decreased energy 0  Change in appetite 0  Feeling bad or failure about yourself  0  Trouble concentrating 0  Moving slowly or fidgety/restless 0  Suicidal thoughts 0  PHQ-9 Score 0  Difficult doing work/chores Not difficult at all   Hypertension: BP Readings from Last 3 Encounters:  06/02/22 116/72  07/15/20 118/85  05/11/19 113/79   Obesity: Wt Readings from Last 3 Encounters:  06/02/22 140 lb (63.5 kg)  07/15/20 148 lb (67.1 kg)  05/11/19 142 lb 9.6 oz (64.7 kg)   BMI Readings from Last 3 Encounters:  06/02/22 23.30 kg/m  07/15/20 24.63 kg/m  05/11/19 23.73 kg/m     Vaccines:   HPV: aged out  Tdap: Up to date  Shingrix: not indicated yet  Pneumonia: not indicated yet   Flu: due in the fall  COVID-19: has had two vaccines so far.    Hep C Screening:  STD testing and prevention (HIV/chl/gon/syphilis):  Intimate partner violence: negative Sexual History : Menstrual History/LMP/Abnormal Bleeding: about 2 weeks ago  Discussed importance of follow up if any post-menopausal bleeding: not  applicable Incontinence Symptoms: No.  Breast cancer:  - Last Mammogram: Normally gets these in Aug but had to cancel last apt - BRCA gene screening:   Osteoporosis Prevention : Discussed high calcium and vitamin D supplementation, weight bearing exercises Bone density :not applicable  Cervical cancer screening:   Skin cancer: Discussed monitoring for atypical lesions  Colorectal cancer: Not recommended yet    Lung cancer:  Low Dose CT Chest recommended if Age 40-80 years, 20 pack-year currently smoking OR have quit w/in 15years. Patient does not qualify.   ECG: NA  Advanced Care Planning: A voluntary discussion about advance care planning including the explanation and discussion of advance directives.  Discussed health care proxy and Living will, and the patient was able to identify a health care proxy as No one .  Patient does not have a living will in effect   Lipids: No results found for: "CHOL" No results found for: "HDL" No results found for: "[place]" No results found for: "TRIG" No results found for: "CHOLHDL" No results found for: "LDLDIRECT"  Glucose: Glucose, Bld  Date Value Ref Range Status  01/23/2017 135 (H) 65 - 99 mg/dL Final    Patient Active Problem List   Diagnosis Date Noted   Pharyngitis 06/27/2020   Flat foot 05/11/2019   Retained placenta or membranes 01/23/2017  Thrombocytopenia ([place]) 12/07/2016   Rupture of membranes with meconium present 12/04/2016   Generalized anxiety disorder 10/11/2016   AMA (advanced maternal age) multigravida 35+, third trimester 10/11/2016   In vitro fertilization 07/21/2016   Supervision of high risk pregnancy in third trimester 06/23/2016   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   Anxiety 07/26/2012   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    Past Surgical History:  Procedure Laterality Date   DILATION AND EVACUATION N/A 01/23/2017   Procedure: DILATATION AND EVACUATION;  Surgeon: Boykin Nearing, MD;  Location: [facility] ORS;  Service: Gynecology;  Laterality: N/A;   KNEE SURGERY      Family History  Problem Relation Age of Onset   Multiple sclerosis Mother    Anxiety disorder Mother    Heart attack Father    Healthy Sister    Healthy Sister    Heart attack Paternal Grandfather    Breast cancer Neg Hx    Colon cancer Neg Hx     Social History   Socioeconomic History   Marital status: Married    Spouse name: Not on file   Number of children: Not on file   Years of education: Not on file   Highest education level: Not on file  Occupational History   Not on file  Tobacco Use   Smoking status: Never   Smokeless tobacco: Never  Vaping Use   Vaping Use: Never used  Substance and Sexual Activity   Alcohol use: Yes    Alcohol/week: 0.0 standard drinks of alcohol    Comment: occasionally   Drug use: No   Sexual activity: Not Currently  Other Topics Concern   Not on file  Social History Narrative   Not on file   Social Determinants of Health   Financial Resource Strain: Not on file  Food Insecurity: Not on file  Transportation Needs: Not on file  Physical Activity: Not on file  Stress: Not on file  Social Connections: Not on file  Intimate Partner Violence: Not on file     Current Outpatient Medications:    acetaminophen (TYLENOL) 500 MG tablet, Take 1,000 mg by mouth every 6 (six) hours as needed.  (Patient not taking: Reported on 11/11/2020), Disp: , Rfl:    amoxicillin-clavulanate (AUGMENTIN) 875-125 MG tablet, Take 1 tablet by mouth 2 (two) times daily. (Patient not taking: Reported on 11/11/2020), Disp: 20 tablet, Rfl: 0   methylphenidate (RITALIN) 10 MG tablet, Take 10 mg by mouth 3 (three) times daily with meals. , Disp: , Rfl:    triamcinolone (NASACORT ALLERGY 24HR) 55 MCG/ACT AERO nasal inhaler, Place 2 sprays into the nose daily. (Patient not taking: Reported on 11/11/2020), Disp: 1 Inhaler, Rfl: 12  No Known Allergies   Review of  Systems  Constitutional:  Negative for chills and fever.  HENT:  Negative for congestion, hearing loss, sore throat and tinnitus.   Eyes:  Negative for blurred vision, double vision and photophobia.  Respiratory:  Negative for shortness of breath and wheezing.   Cardiovascular:  Negative for chest pain, palpitations and leg swelling.  Gastrointestinal:  Negative for blood in stool, constipation, diarrhea, nausea and vomiting.  Genitourinary:  Negative for dysuria.  Musculoskeletal:  Negative for falls, joint pain, myalgias and neck pain.  Skin:  Negative for itching and rash.  Neurological:  Negative for dizziness, tingling, tremors, loss of consciousness and headaches.  Psychiatric/Behavioral:  Negative for depression and memory loss. The patient is not nervous/anxious and does not have insomnia.  Objective  Vitals:   06/02/22 1324  BP: 116/72  Pulse: 83  Temp: (!) 97.3 F (36.3 C)  TempSrc: Temporal  SpO2: 98%  Weight: 140 lb (63.5 kg)  Height: 5' 5"  (1.651 m)    Body mass index is 23.3 kg/m.  Physical Exam Vitals reviewed.  Constitutional:      General: She is awake.     Appearance: Normal appearance. She is well-developed, well-groomed and normal weight.  HENT:     Head: Normocephalic and atraumatic.     Right Ear: Tympanic membrane, ear canal and external ear normal.     Left Ear: Tympanic membrane, ear canal and external ear normal.     Nose: Nose normal.     Mouth/Throat:     Mouth: Mucous membranes are moist.     Pharynx: Oropharynx is clear. No oropharyngeal exudate or posterior oropharyngeal erythema.     Tonsils: No tonsillar exudate or tonsillar abscesses.  Eyes:     General: Lids are normal. Gaze aligned appropriately.     Extraocular Movements: Extraocular movements intact.     Right eye: Normal extraocular motion and no nystagmus.     Left eye: Normal extraocular motion and no nystagmus.     Conjunctiva/sclera: Conjunctivae normal.     Pupils:  Pupils are equal, round, and reactive to light.  Neck:     Thyroid: No thyroid mass, thyromegaly or thyroid tenderness.  Cardiovascular:     Rate and Rhythm: Normal rate and regular rhythm.     Pulses: Normal pulses.          Radial pulses are 2+ on the right side and 2+ on the left side.     Heart sounds: Normal heart sounds.  Pulmonary:     Effort: Pulmonary effort is normal.     Breath sounds: Normal breath sounds. No decreased breath sounds, wheezing, rhonchi or rales.  Abdominal:     General: Abdomen is flat. Bowel sounds are normal.     Palpations: Abdomen is soft.     Tenderness: There is no abdominal tenderness.  Musculoskeletal:        General: Normal range of motion.     Cervical back: Normal range of motion and neck supple.     Right lower leg: No edema.     Left lower leg: No edema.  Lymphadenopathy:     Head:     Right side of head: No submental or submandibular adenopathy.     Left side of head: No submental or submandibular adenopathy.     Upper Body:     Right upper body: No supraclavicular adenopathy.     Left upper body: No supraclavicular adenopathy.  Skin:    General: Skin is warm and dry.     Capillary Refill: Capillary refill takes less than 2 seconds.  Neurological:     General: No focal deficit present.     Mental Status: She is alert and oriented to person, place, and time.     Cranial Nerves: Cranial nerves 2-12 are intact. No cranial nerve deficit, dysarthria or facial asymmetry.     Motor: Motor function is intact. No weakness, tremor, atrophy or abnormal muscle tone.     Gait: Gait is intact. Gait normal.     Deep Tendon Reflexes:     Reflex Scores:      Tricep reflexes are 2+ on the right side and 2+ on the left side.      Patellar reflexes are 2+ on the  right side and 2+ on the left side. Psychiatric:        Attention and Perception: Attention and perception normal.        Mood and Affect: Mood and affect normal.        Speech: Speech normal.         Behavior: Behavior normal. Behavior is cooperative.        Thought Content: Thought content normal.        Cognition and Memory: Cognition and memory normal.        Judgment: Judgment normal.      No results found for this or any previous visit (from the past 2160 hour(s)).   Fall Risk:    06/02/2022    1:42 PM 12/26/2015    2:19 PM  [place] in the past year? 0 No  Number falls in past yr: 0   Injury with Fall? 0   Risk for fall due to : No Fall Risks   Follow up Falls evaluation completed      Functional Status Survey: Is the patient deaf or have difficulty hearing?: No Does the patient have difficulty seeing, even when wearing glasses/contacts?: No Does the patient have difficulty concentrating, remembering, or making decisions?: No Does the patient have difficulty walking or climbing stairs?: No Does the patient have difficulty dressing or bathing?: No Does the patient have difficulty doing errands alone such as visiting a doctor's office or shopping?: No   Assessment & Plan   Problem List Items Addressed This Visit   None Visit Diagnoses     Encounter for screening mammogram for malignant neoplasm of breast    -  Primary   Relevant Orders   MM 3D SCREEN BREAST BILATERAL   Encounter for routine history and physical exam in female      -USPSTF grade A and B recommendations reviewed with patient; age-appropriate recommendations, preventive care, screening tests, etc discussed and encouraged; healthy living encouraged; see AVS for patient education given to patient -Discussed importance of 150 minutes of physical activity weekly, eat two servings of fish weekly, eat one serving of tree nuts ( cashews, pistachios, pecans, almonds.Marland Kitchen) every other day, eat 6 servings of fruit/vegetables daily and drink plenty of water and avoid sweet beverages.   -Reviewed Health Maintenance: Yes.    Further titers collected for patient's new employment per request   Recommend she make follow up apt in the next 1-3 months to have lipid panel drawn as she was not fasting today.    Relevant Orders   COMPLETE METABOLIC PANEL WITH GFR   CBC w/Diff/Platelet   Measles/Mumps/Rubella Immunity   Hepatitis B surface antibody,quantitative   TSH   HgB A1c   QuantiFERON-TB Gold Plus        No follow-ups on file.   I, Roiza Wiedel E Creed Kail, PA-C, have reviewed all documentation for this visit. The documentation on 06/02/22 for the exam, diagnosis, procedures, and orders are all accurate and complete.   Talitha Givens, MHS, PA-C [facility]

## 2022-06-04 LAB — QUANTIFERON-TB GOLD PLUS
Mitogen-NIL: 7.02 IU/mL
NIL: 0.01 IU/mL
QuantiFERON-TB Gold Plus: NEGATIVE
TB1-NIL: 0.01 IU/mL
TB2-NIL: 0 IU/mL

## 2022-06-04 LAB — HEPATITIS B SURFACE ANTIBODY, QUANTITATIVE: Hep B S AB Quant (Post): <5 m[IU]/mL — ABNORMAL LOW (ref >=10)

## 2022-06-04 LAB — ADVANCED WRITTEN NOTIFICATION (AWN) TEST REFUSAL

## 2022-06-04 LAB — TEST AUTHORIZATION: TEST CODE:: 5259

## 2022-06-04 LAB — MEASLES/MUMPS/RUBELLA IMMUNITY
Mumps IgG: 56 AU/mL
Rubella: 2.33 Index
Rubeola IgG: <13.5 AU/mL — ABNORMAL LOW

## 2022-06-08 ENCOUNTER — Telehealth: Payer: Self-pay

## 2022-06-08 NOTE — Telephone Encounter (Signed)
Copied from [name] [id number]. Topic: General - Inquiry >> Jun 05, 2022  2:18 PM Regina [name] wrote: Reason for CRM: Pt called asking about her labs and the results.  She also asked if he could get a printed copy of them.  Cb# [phone]

## 2022-10-30 ENCOUNTER — Encounter: Payer: Self-pay | Admitting: Internal Medicine

## 2022-10-30 ENCOUNTER — Ambulatory Visit: Payer: BC Managed Care – PPO | Admitting: Internal Medicine

## 2022-10-30 VITALS — BP 118/68 | HR 65 | Temp 97.5°F | Wt 141.0 lb

## 2022-10-30 DIAGNOSIS — J069 Acute upper respiratory infection, unspecified: Secondary | ICD-10-CM | POA: Diagnosis not present

## 2022-10-30 MED ORDER — BENZONATATE 200 MG PO CAPS: 200.0000 mg | ORAL_CAPSULE | Freq: Three times a day (TID) | ORAL | 0 refills | Status: AC | PRN

## 2022-10-30 NOTE — Patient Instructions (Signed)

## 2022-10-30 NOTE — Progress Notes (Signed)
Subjective:    Patient ID: Regina [name], female    DOB: 1981-09-24, 41 y.o.   MRN: [id number]  HPI  Patient presents to clinic today with complaint of a fatigue, hoarseness and cough.  This started 1 week ago. The cough is nonproductive. She denies headache, runny nose, ear pain, sore throat, shortness of breath, chest pain, nausea, vomiting, or diarrhea.  She denies fever, chills or body aches. She has tried Mucinex OTC with minimal relief of symptoms.  She has not had sick contacts with similar symptoms.  She denies exposure to COVID or flu.  Review of Systems     Past Medical History:  Diagnosis Date   ADHD (attention deficit hyperactivity disorder)    Anxiety    Dysplastic nevus 06/03/2015   Right post. side. Mild atypia, lateral margin involved.    Dysplastic nevus 11/11/2020   Left pretibial. Moderate to severe atypia, peripheral margin invovled.    Current Outpatient Medications  Medication Sig Dispense Refill   methylphenidate (RITALIN) 10 MG tablet Take 10 mg by mouth 3 (three) times daily with meals.      triamcinolone (NASACORT ALLERGY 24HR) 55 MCG/ACT AERO nasal inhaler Place 2 sprays into the nose daily. (Patient not taking: Reported on 11/11/2020) 1 Inhaler 12   No current facility-administered medications for this visit.    No Known Allergies  Family History  Problem Relation Age of Onset   Multiple sclerosis Mother    Anxiety disorder Mother    Heart attack Father    Healthy Sister    Healthy Sister    Heart attack Paternal Grandfather    Breast cancer Neg Hx    Colon cancer Neg Hx     Social History   Socioeconomic History   Marital status: Married    Spouse name: Not on file   Number of children: Not on file   Years of education: Not on file   Highest education level: Not on file  Occupational History   Not on file  Tobacco Use   Smoking status: Never   Smokeless tobacco: Never  Vaping Use   Vaping Use: Never used  Substance and Sexual  Activity   Alcohol use: Yes    Alcohol/week: 0.0 standard drinks of alcohol    Comment: occasionally   Drug use: No   Sexual activity: Not Currently  Other Topics Concern   Not on file  Social History Narrative   Not on file   Social Determinants of Health   Financial Resource Strain: Not on file  Food Insecurity: Not on file  Transportation Needs: Not on file  Physical Activity: Not on file  Stress: Not on file  Social Connections: Not on file  Intimate Partner Violence: Not on file     Constitutional: Denies fever, malaise, fatigue, headache or abrupt weight changes.  HEENT: Denies eye pain, eye redness, ear pain, ringing in the ears, wax buildup, runny nose, nasal congestion, bloody nose, or sore throat. Respiratory: Patient reports cough.  Denies difficulty breathing, shortness of breath, or sputum production.   Cardiovascular: Denies chest pain, chest tightness, palpitations or swelling in the hands or feet.  Gastrointestinal: Denies abdominal pain, bloating, constipation, diarrhea or blood in the stool.   No other specific complaints in a complete review of systems (except as listed in HPI above).  Objective:   Physical Exam BP 118/68 (BP Location: Right Arm, Patient Position: Sitting, Cuff Size: Normal)   Pulse 65   Temp (!) 97.5 F (36.4 C) (  Temporal)   Wt 141 lb (64 kg)   SpO2 99%   BMI 23.46 kg/m   Wt Readings from Last 3 Encounters:  06/02/22 140 lb (63.5 kg)  07/15/20 148 lb (67.1 kg)  05/11/19 142 lb 9.6 oz (64.7 kg)    General: Appears her stated age, well developed, well nourished in NAD. Skin: Warm, dry and intact. No rash noted. HEENT: Head: normal shape and size; Eyes: sclera white, no icterus, conjunctiva pink, PERRLA and EOMs intact; Throat/Mouth: Teeth present, mucosa pink and moist, + PND, no exudate, lesions or ulcerations noted.  Neck: No and Cardiovascular: Normal rate and rhythm. S1,S2 noted.  No murmur, rubs or gallops noted.   Pulmonary/Chest: Normal effort and positive vesicular breath sounds. No respiratory distress. No wheezes, rales or ronchi noted.     BMET    Component Value Date/Time   NA 138 01/23/2017 1430   K 3.6 01/23/2017 1430   CL 107 01/23/2017 1430   CO2 23 01/23/2017 1430   GLUCOSE 135 (H) 01/23/2017 1430   BUN 19 01/23/2017 1430   CREATININE 1.03 (H) 01/23/2017 1430   CALCIUM 8.7 (L) 01/23/2017 1430   GFRNONAA >60 01/23/2017 1430   GFRAA >60 01/23/2017 1430    Lipid Panel  No results found for: "CHOL", "TRIG", "HDL", "CHOLHDL", "VLDL", "LDLCALC"  CBC    Component Value Date/Time   WBC 10.8 01/23/2017 1430   RBC 4.32 01/23/2017 1430   HGB 11.7 (L) 01/23/2017 1430   HCT 36.3 01/23/2017 1430   PLT 235 01/23/2017 1430   MCV 84.0 01/23/2017 1430   MCH 27.1 01/23/2017 1430   MCHC 32.3 01/23/2017 1430   RDW 19.2 (H) 01/23/2017 1430    Hgb A1C No results found for: "HGBA1C"         Assessment & Plan:   Cough:  Likely secondary to postnasal drip Start antihistamine OTC daily x 1 week Rx for Tessalon 200 mg every 8 hours as needed Encourage adequate water intake No indication for antibiotics or steroids at this time  Follow-up with your PCP as previously scheduled Webb Silversmith, NP

## 2022-12-31 ENCOUNTER — Ambulatory Visit: Payer: BC Managed Care – PPO | Admitting: Dermatology

## 2022-12-31 DIAGNOSIS — L821 Other seborrheic keratosis: Secondary | ICD-10-CM

## 2022-12-31 DIAGNOSIS — L814 Other melanin hyperpigmentation: Secondary | ICD-10-CM | POA: Diagnosis not present

## 2022-12-31 DIAGNOSIS — L820 Inflamed seborrheic keratosis: Secondary | ICD-10-CM

## 2022-12-31 DIAGNOSIS — L578 Other skin changes due to chronic exposure to nonionizing radiation: Secondary | ICD-10-CM

## 2022-12-31 DIAGNOSIS — Z1283 Encounter for screening for malignant neoplasm of skin: Secondary | ICD-10-CM | POA: Diagnosis not present

## 2022-12-31 DIAGNOSIS — D229 Melanocytic nevi, unspecified: Secondary | ICD-10-CM

## 2022-12-31 DIAGNOSIS — L905 Scar conditions and fibrosis of skin: Secondary | ICD-10-CM

## 2022-12-31 DIAGNOSIS — D2239 Melanocytic nevi of other parts of face: Secondary | ICD-10-CM

## 2022-12-31 DIAGNOSIS — Z86018 Personal history of other benign neoplasm: Secondary | ICD-10-CM

## 2022-12-31 DIAGNOSIS — D225 Melanocytic nevi of trunk: Secondary | ICD-10-CM

## 2022-12-31 NOTE — Progress Notes (Signed)
Follow-Up Visit   Subjective  Regina [name] is a 42 y.o. female who presents for the following: Total body skin exam (Hx of Dysplastic Nevi) and check spots (Chest, /R cheek/R leg, peeled off). The patient presents for Total-Body Skin Exam (TBSE) for skin cancer screening and mole check.  The patient has spots, moles and lesions to be evaluated, some may be new or changing and the patient has concerns that these could be cancer.   The following portions of the chart were reviewed this encounter and updated as appropriate:   Tobacco  Allergies  Meds  Problems  Med Hx  Surg Hx  Fam Hx     Review of Systems:  No other skin or systemic complaints except as noted in HPI or Assessment and Plan.  Objective  Well appearing patient in no apparent distress; mood and affect are within normal limits.  A full examination was performed including scalp, head, eyes, ears, nose, lips, neck, chest, axillae, abdomen, back, buttocks, bilateral upper extremities, bilateral lower extremities, hands, feet, fingers, toes, fingernails, and toenails. All findings within normal limits unless otherwise noted below.  R chest x 1 Stuck on waxy paps with erythema  R cheek Tiny dimple no texture change   Assessment & Plan   Lentigines - Scattered tan macules - Due to sun exposure - Benign-appearing, observe - Recommend daily broad spectrum sunscreen SPF 30+ to sun-exposed areas, reapply every 2 hours as needed. - Call for any changes - upper back  Seborrheic Keratoses - Stuck-on, waxy, tan-brown papules and/or plaques  - Benign-appearing - Discussed benign etiology and prognosis. - Observe - Call for any changes - trunk  Melanocytic Nevi - Tan-brown and/or pink-flesh-colored symmetric macules and papules - Benign appearing on exam today - Observation - Call clinic for new or changing moles - Recommend daily use of broad spectrum spf 30+ sunscreen to sun-exposed areas.  - face,  trunk  Hemangiomas - Red papules - Discussed benign nature - Observe - Call for any changes - trunk  Actinic Damage - Chronic condition, secondary to cumulative UV/sun exposure - diffuse scaly erythematous macules with underlying dyspigmentation - Recommend daily broad spectrum sunscreen SPF 30+ to sun-exposed areas, reapply every 2 hours as needed.  - Staying in the shade or wearing long sleeves, sun glasses (UVA+UVB protection) and wide brim hats (4-inch brim around the entire circumference of the hat) are also recommended for sun protection.  - Call for new or changing lesions.  Skin cancer screening performed today.   Inflamed seborrheic keratosis R chest x 1 Symptomatic, irritating, patient would like treated. If not resolved in 2 months return to clinic  Destruction of lesion - R chest x 1 Complexity: simple   Destruction method: cryotherapy   Informed consent: discussed and consent obtained   Timeout:  patient name, date of birth, surgical site, and procedure verified Lesion destroyed using liquid nitrogen: Yes   Region frozen until ice ball extended beyond lesion: Yes   Outcome: patient tolerated procedure well with no complications   Post-procedure details: wound care instructions given    Scar R cheek Benign, observe  History of Dysplastic Nevi - No evidence of recurrence today - Recommend regular full body skin exams - Recommend daily broad spectrum sunscreen SPF 30+ to sun-exposed areas, reapply every 2 hours as needed.  - Call if any new or changing lesions are noted between office visits  - R post side, L pretibial  Return in about 1 year (around 01/01/2024)  for TBSE, Hx of Dysplastic nevi.  I, Othelia Pulling, RMA, am acting as scribe for Sarina Ser, MD . Documentation: I have reviewed the above documentation for accuracy and completeness, and I agree with the above.  Sarina Ser, MD

## 2022-12-31 NOTE — Patient Instructions (Addendum)

## 2023-01-12 ENCOUNTER — Encounter: Payer: Self-pay | Admitting: Dermatology

## 2024-01-05 ENCOUNTER — Ambulatory Visit: Payer: BC Managed Care – PPO | Admitting: Dermatology

## 2024-02-03 ENCOUNTER — Ambulatory Visit: Payer: BC Managed Care – PPO | Admitting: Dermatology

## 2024-02-07 ENCOUNTER — Encounter: Payer: Self-pay | Admitting: Dermatology

## 2024-02-07 ENCOUNTER — Ambulatory Visit: Admitting: Dermatology

## 2024-02-07 DIAGNOSIS — L821 Other seborrheic keratosis: Secondary | ICD-10-CM

## 2024-02-07 DIAGNOSIS — D229 Melanocytic nevi, unspecified: Secondary | ICD-10-CM

## 2024-02-07 DIAGNOSIS — Z1283 Encounter for screening for malignant neoplasm of skin: Secondary | ICD-10-CM | POA: Diagnosis not present

## 2024-02-07 DIAGNOSIS — W908XXA Exposure to other nonionizing radiation, initial encounter: Secondary | ICD-10-CM

## 2024-02-07 DIAGNOSIS — L578 Other skin changes due to chronic exposure to nonionizing radiation: Secondary | ICD-10-CM | POA: Diagnosis not present

## 2024-02-07 DIAGNOSIS — L814 Other melanin hyperpigmentation: Secondary | ICD-10-CM | POA: Diagnosis not present

## 2024-02-07 DIAGNOSIS — L820 Inflamed seborrheic keratosis: Secondary | ICD-10-CM | POA: Diagnosis not present

## 2024-02-07 DIAGNOSIS — D1801 Hemangioma of skin and subcutaneous tissue: Secondary | ICD-10-CM

## 2024-02-07 DIAGNOSIS — Z86018 Personal history of other benign neoplasm: Secondary | ICD-10-CM

## 2024-02-07 NOTE — Patient Instructions (Addendum)

## 2024-02-07 NOTE — Progress Notes (Signed)
 Follow-Up Visit   Subjective  Regina [name] is a 43 y.o. female who presents for the following: Skin Cancer Screening and Full Body Skin Exam, hx of Dysplastic nevus.  The patient presents for Total-Body Skin Exam (TBSE) for skin cancer screening and mole check. The patient has spots, moles and lesions to be evaluated, some may be new or changing and the patient may have concern these could be cancer.  The following portions of the chart were reviewed this encounter and updated as appropriate: medications, allergies, medical history  Review of Systems:  No other skin or systemic complaints except as noted in HPI or Assessment and Plan.  Objective  Well appearing patient in no apparent distress; mood and affect are within normal limits.  A full examination was performed including scalp, head, eyes, ears, nose, lips, neck, chest, axillae, abdomen, back, buttocks, bilateral upper extremities, bilateral lower extremities, hands, feet, fingers, toes, fingernails, and toenails. All findings within normal limits unless otherwise noted below.   Relevant physical exam findings are noted in the Assessment and Plan.  left mid back lateral below braline x 1, right medial inferior breast x 1 (2) Stuck-on, waxy, tan-brown papules and plaques -- Discussed benign etiology and prognosis.   Assessment & Plan   SKIN CANCER SCREENING PERFORMED TODAY.  ACTINIC DAMAGE - Chronic condition, secondary to cumulative UV/sun exposure - diffuse scaly erythematous macules with underlying dyspigmentation - Recommend daily broad spectrum sunscreen SPF 30+ to sun-exposed areas, reapply every 2 hours as needed.  - Staying in the shade or wearing long sleeves, sun glasses (UVA+UVB protection) and wide brim hats (4-inch brim around the entire circumference of the hat) are also recommended for sun protection.  - Call for new or changing lesions.  LENTIGINES, SEBORRHEIC KERATOSES, HEMANGIOMAS - Benign normal skin  lesions - Benign-appearing - Call for any changes  MELANOCYTIC NEVI - Tan-brown and/or pink-flesh-colored symmetric macules and papules - Benign appearing on exam today - Observation - Call clinic for new or changing moles - Recommend daily use of broad spectrum spf 30+ sunscreen to sun-exposed areas.   HISTORY OF DYSPLASTIC NEVUS No evidence of recurrence today Recommend regular full body skin exams Recommend daily broad spectrum sunscreen SPF 30+ to sun-exposed areas, reapply every 2 hours as needed.  Call if any new or changing lesions are noted between office visits   HISTORY OF DYSPLASTIC NEVUS No evidence of recurrence today Recommend regular full body skin exams Recommend daily broad spectrum sunscreen SPF 30+ to sun-exposed areas, reapply every 2 hours as needed.  Call if any new or changing lesions are noted between office visits   INFLAMED SEBORRHEIC KERATOSIS (2) left mid back lateral below braline x 1, right medial inferior breast x 1 (2) Symptomatic, irritating, patient would like treated.  Destruction of lesion - left mid back lateral below braline x 1, right medial inferior breast x 1 (2) Complexity: simple   Destruction method: cryotherapy   Informed consent: discussed and consent obtained   Timeout:  patient name, date of birth, surgical site, and procedure verified Lesion destroyed using liquid nitrogen: Yes   Region frozen until ice ball extended beyond lesion: Yes   Outcome: patient tolerated procedure well with no complications   Post-procedure details: wound care instructions given   Additional details:  Prior to procedure, discussed risks of blister formation, small wound, skin dyspigmentation, or rare scar following cryotherapy. Recommend Vaseline ointment to treated areas while healing.   Return in about 1 year (around 02/06/2025)  for TBSE, hx of Dysplastic nevus .  IAngelique Holm, CMA, am acting as scribe for Armida Sans, MD .   Documentation: I  have reviewed the above documentation for accuracy and completeness, and I agree with the above.  Armida Sans, MD

## 2025-02-13 ENCOUNTER — Ambulatory Visit: Admitting: Dermatology
